# Patient Record
Sex: Male | Born: 1970 | Race: Black or African American | Hispanic: No | Marital: Married | Smoking: Never smoker
Health system: Southern US, Community
[De-identification: ages and names within clinical notes are randomized; demographics above are authoritative.]

## PROBLEM LIST (undated history)

## (undated) DIAGNOSIS — E119 Type 2 diabetes mellitus without complications: Secondary | ICD-10-CM

## (undated) DIAGNOSIS — K219 Gastro-esophageal reflux disease without esophagitis: Secondary | ICD-10-CM

---

## 2003-11-07 ENCOUNTER — Other Ambulatory Visit: Payer: Self-pay

## 2004-08-27 ENCOUNTER — Emergency Department: Payer: Self-pay | Admitting: Emergency Medicine

## 2004-08-27 ENCOUNTER — Other Ambulatory Visit: Payer: Self-pay

## 2004-08-28 ENCOUNTER — Other Ambulatory Visit: Payer: Self-pay

## 2005-04-08 ENCOUNTER — Emergency Department: Payer: Self-pay | Admitting: Emergency Medicine

## 2005-08-23 ENCOUNTER — Emergency Department: Payer: Self-pay | Admitting: Emergency Medicine

## 2005-10-24 ENCOUNTER — Ambulatory Visit: Payer: Self-pay | Admitting: Ophthalmology

## 2007-07-26 IMAGING — CR DG CHEST 2V
1 series · 2 of 2 positions shown · non-contrast
Comparison: none

REASON FOR EXAM: Chest pain
COMMENTS:

PROCEDURE:     DXR - DXR CHEST PA (OR AP) AND LATERAL  - October 24, 2005 [DATE]
RESULT:     The lung fields are clear. No pneumonia, pneumothorax or pleural
effusion is seen. The heart, mediastinal and osseous structures show no
significant abnormalities.

[Series 1: view not recorded · 0.17mm/px · 2 of 2 slices shown]
[im 1/2]
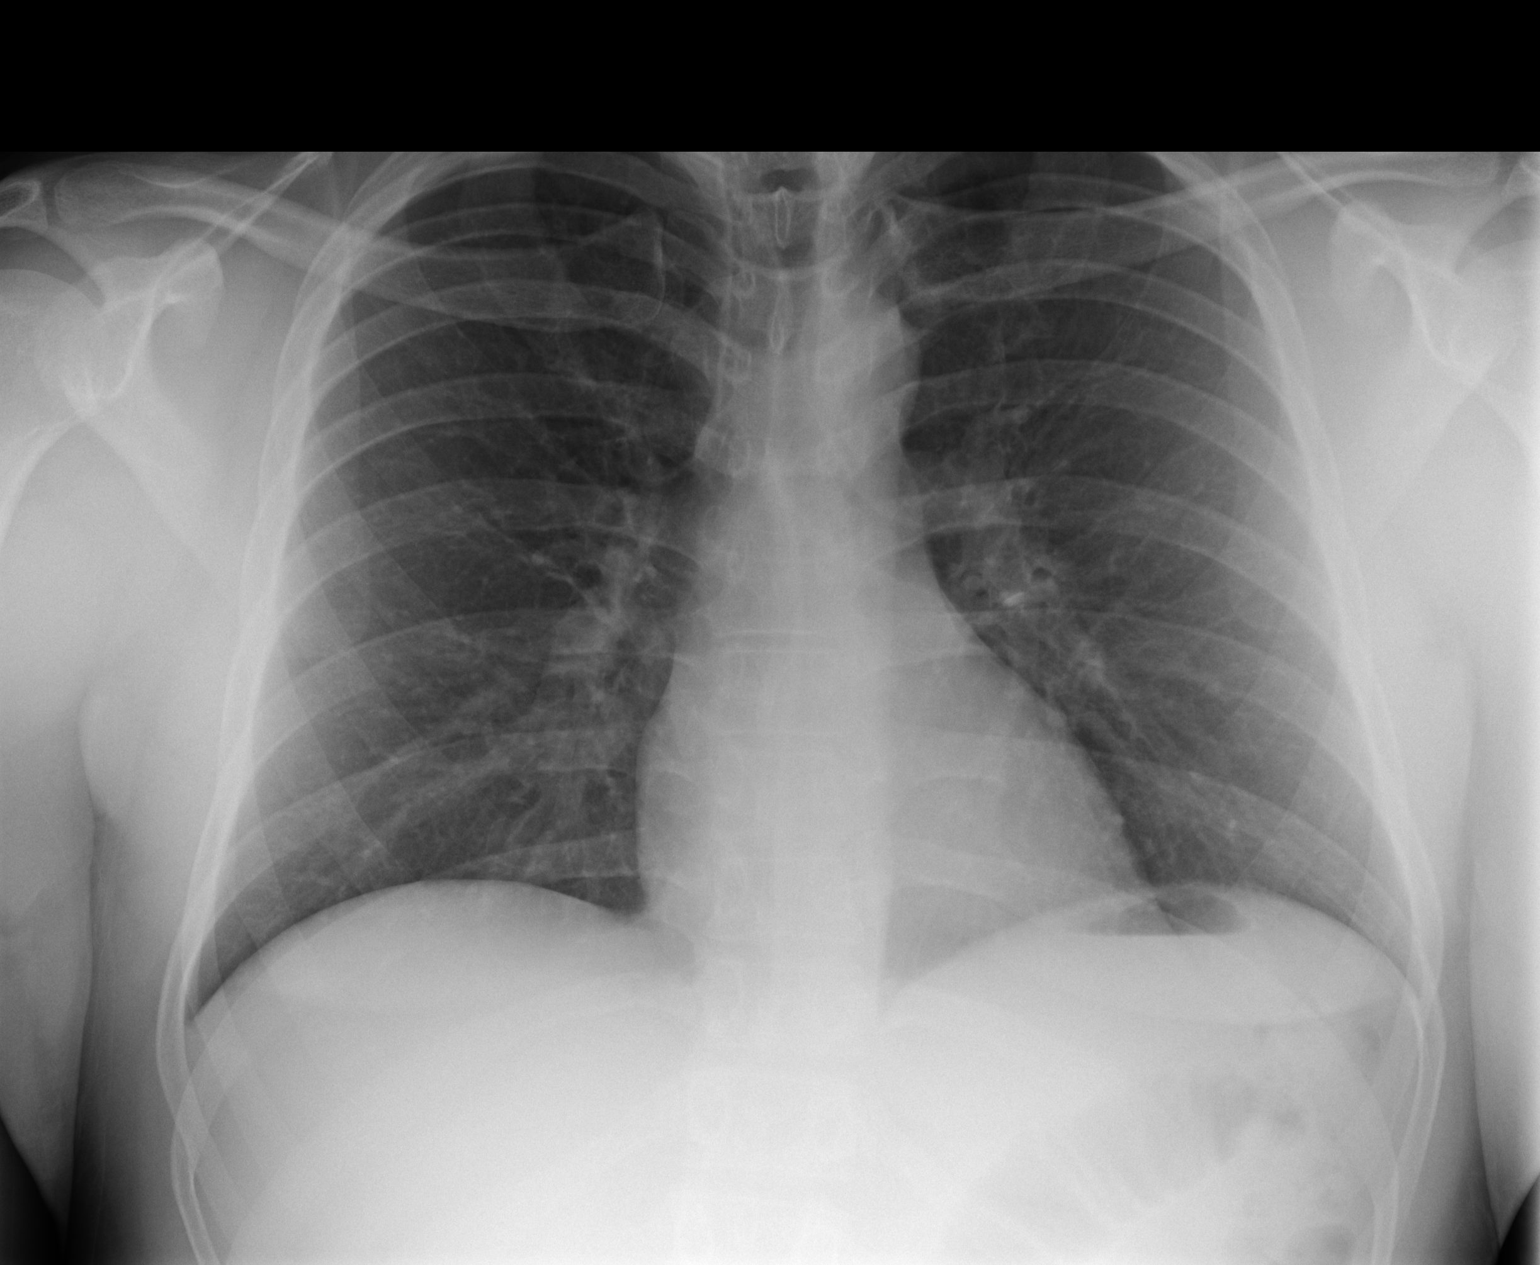
[im 2/2]
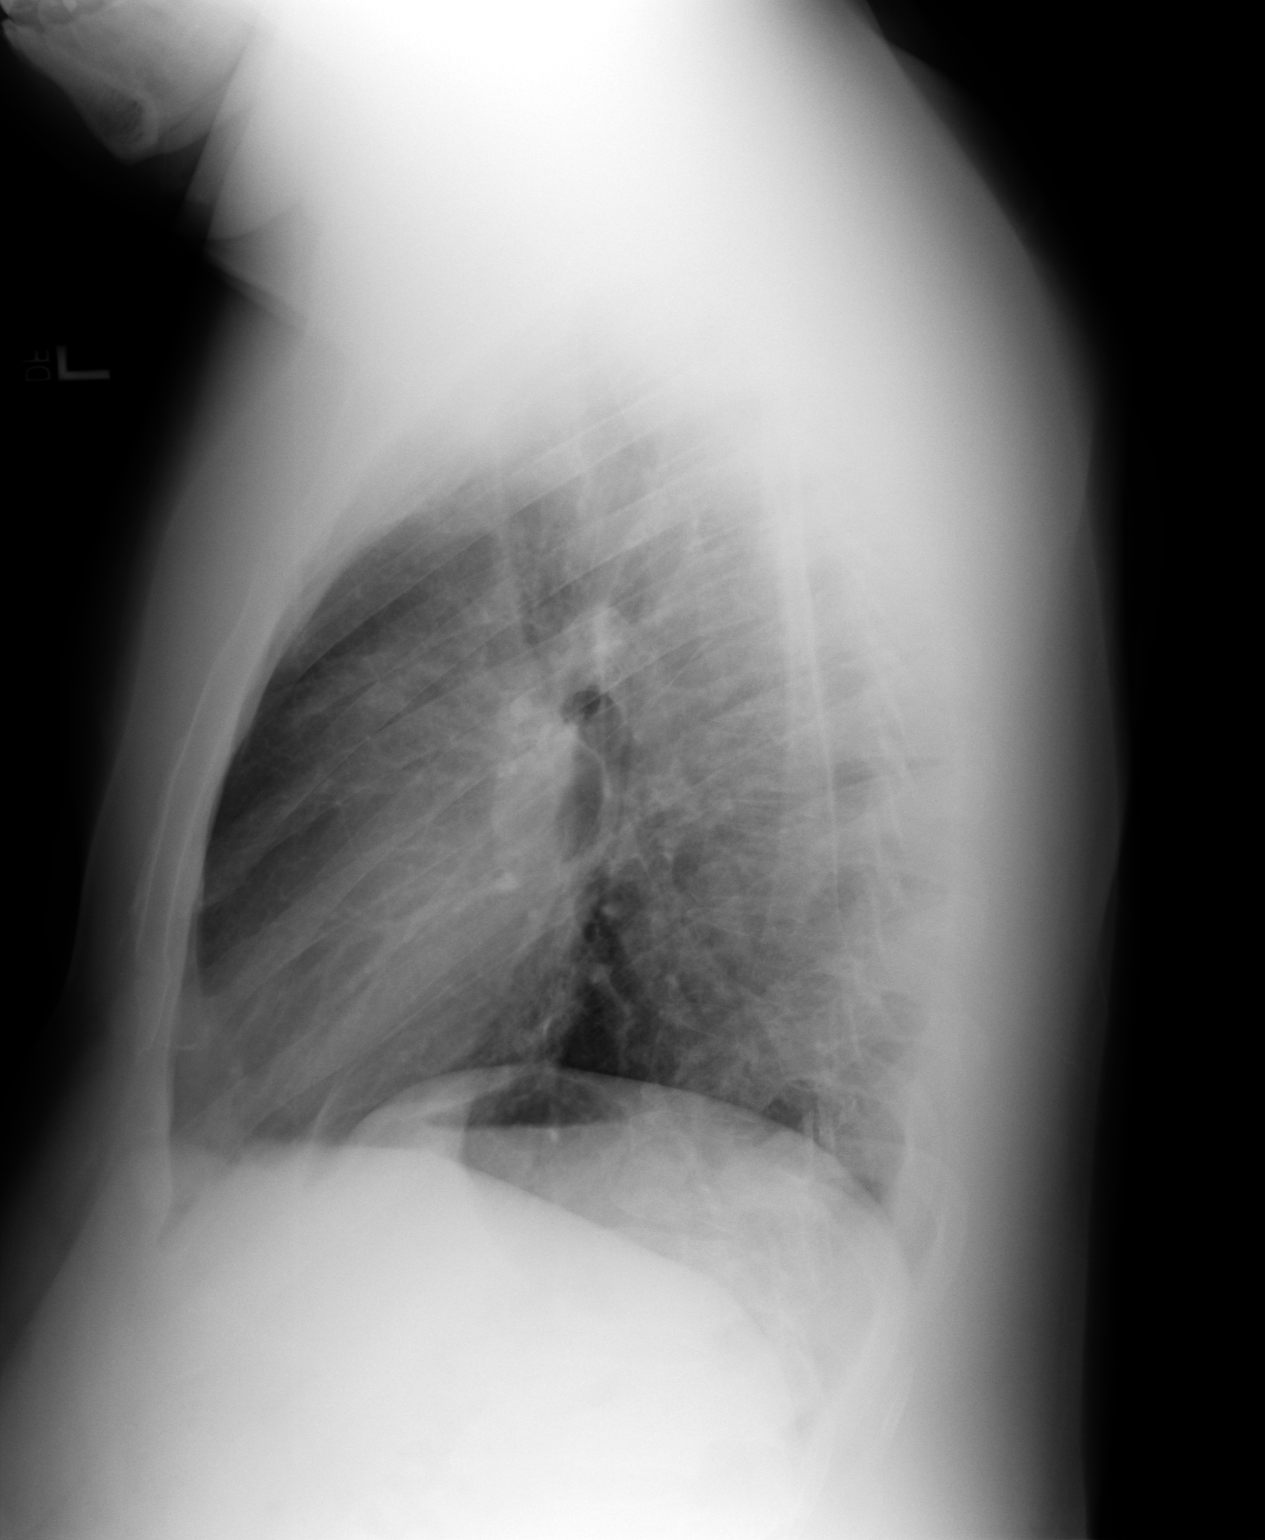

[2 of 2 positions shown; findings below may reference images not displayed]

IMPRESSION: No acute changes are identified.

## 2007-12-11 ENCOUNTER — Ambulatory Visit: Payer: Self-pay | Admitting: Internal Medicine

## 2010-03-23 ENCOUNTER — Ambulatory Visit: Payer: Self-pay | Admitting: Internal Medicine

## 2010-11-07 ENCOUNTER — Ambulatory Visit: Payer: Self-pay | Admitting: Family Medicine

## 2013-11-24 DIAGNOSIS — K429 Umbilical hernia without obstruction or gangrene: Secondary | ICD-10-CM | POA: Insufficient documentation

## 2014-03-02 ENCOUNTER — Ambulatory Visit: Payer: Self-pay | Admitting: Family Medicine

## 2017-02-06 DIAGNOSIS — R7303 Prediabetes: Secondary | ICD-10-CM | POA: Insufficient documentation

## 2017-11-04 DIAGNOSIS — G4730 Sleep apnea, unspecified: Secondary | ICD-10-CM | POA: Insufficient documentation

## 2017-12-06 ENCOUNTER — Other Ambulatory Visit: Payer: Self-pay

## 2017-12-06 ENCOUNTER — Ambulatory Visit
Admission: EM | Admit: 2017-12-06 | Discharge: 2017-12-06 | Disposition: A | Discharge status: Home / Self Care | Payer: BLUE CROSS/BLUE SHIELD | Attending: Family Medicine | Admitting: Family Medicine

## 2017-12-06 DIAGNOSIS — H1032 Unspecified acute conjunctivitis, left eye: Secondary | ICD-10-CM

## 2017-12-06 HISTORY — DX: Type 2 diabetes mellitus without complications: E11.9

## 2017-12-06 HISTORY — DX: Gastro-esophageal reflux disease without esophagitis: K21.9

## 2017-12-06 MED ORDER — MOXIFLOXACIN HCL 0.5 % OP SOLN: 1.0000 [drp] | Freq: Three times a day (TID) | OPHTHALMIC | 0 refills | Status: DC

## 2017-12-06 NOTE — ED Triage Notes (Addendum)
Pt reports he is being treated for a sinus infection. Awoke this a.m. With left eye crusted shut and reddened sclera VISUAL ACUITY: Uncorrected Left eye: 20/30 Right eye: 20/50 Both eyes: 20/40

## 2017-12-06 NOTE — ED Provider Notes (Signed)
[facility]    CSN: [id number] Arrival date & time: 12/06/17  1426     History   Chief Complaint Chief Complaint  Patient presents with  . Eye Problem    HPI William Hall is a 47 y.o. male.   The history is provided by the patient.  Eye Problem  Location:  Left eye Quality:  Tearing Severity:  Moderate Onset quality:  Sudden Duration:  2 days Timing:  Constant Progression:  Worsening Chronicity:  New Context: not burn, not chemical exposure, not contact lens problem, not direct trauma, not foreign body, not using machinery, not scratch, not smoke exposure and not UV exposure   Relieved by:  None tried Ineffective treatments:  None tried Associated symptoms: crusting, discharge and redness   Associated symptoms: no blurred vision, no decreased vision, no double vision, no facial rash, no headaches, no inflammation, no itching, no nausea, no numbness, no photophobia, no scotomas, no swelling, no tearing, no tingling, no vomiting and no weakness   Risk factors: exposure to pinkeye and recent URI   Risk factors: no conjunctival hemorrhage, no previous injury to eye and no recent herpes zoster     Past Medical History:  Diagnosis Date  . Diabetes mellitus without complication (HCC)   . GERD (gastroesophageal reflux disease)     There are no active problems to display for this patient.   History reviewed. No pertinent surgical history.     Home Medications    Prior to Admission medications   Medication Sig Start Date End Date Taking? Authorizing Provider  metFORMIN (GLUCOPHAGE) 1000 MG tablet Take 1,000 mg by mouth 2 (two) times daily with a meal.   Yes [provider]  pantoprazole (PROTONIX) 20 MG tablet Take 20 mg by mouth daily.   Yes [provider]  penicillin v potassium (VEETID) 500 MG tablet Take 500 mg by mouth 4 (four) times daily.   Yes [provider]  moxifloxacin (VIGAMOX) 0.5 % ophthalmic solution Place 1  drop into the left eye 3 (three) times daily. 12/06/17   Payton Mccallum, MD    Family History Family History  Problem Relation Age of Onset  . Diabetes Mother   . Cancer Father     Social History Social History   Tobacco Use  . Smoking status: Never Smoker  . Smokeless tobacco: Never Used  Substance Use Topics  . Alcohol use: No    Frequency: Never  . Drug use: No     Allergies   Patient has no known allergies.   Review of Systems Review of Systems  Eyes: Positive for discharge and redness. Negative for blurred vision, double vision, photophobia and itching.  Gastrointestinal: Negative for nausea and vomiting.  Neurological: Negative for tingling, weakness, numbness and headaches.     Physical Exam Triage Vital Signs ED Triage Vitals [12/06/17 1509]  Enc Vitals Group     BP 126/75     Pulse Rate 60     Resp 18     Temp 98.1 F (36.7 C)     Temp Source Oral     SpO2 99 %     Weight 270 lb (122.5 kg)     Height 6' (1.829 m)     Head Circumference      Peak Flow      Pain Score 1     Pain Loc      Pain Edu?      Excl. in [place]?    No  data found.  Updated Vital Signs BP 126/75 (BP Location: Right Arm)   Pulse 60   Temp 98.1 F (36.7 C) (Oral)   Resp 18   Ht 6' (1.829 m)   Wt 270 lb (122.5 kg)   SpO2 99%   BMI 36.62 kg/m   Visual Acuity Right Eye Distance:   Left Eye Distance:   Bilateral Distance:    Right Eye Near:   Left Eye Near:    Bilateral Near:     Physical Exam  Constitutional: He appears well-developed and well-nourished. No distress.  Eyes: EOM and lids are normal. Pupils are equal, round, and reactive to light. Left eye exhibits discharge. Left conjunctiva is injected.  Skin: He is not diaphoretic.  Nursing note and vitals reviewed.    UC Treatments / Results  Labs (all labs ordered are listed, but only abnormal results are displayed) Labs Reviewed - No data to display  EKG  EKG Interpretation None       Radiology No  results found.  Procedures Procedures (including critical care time)  Medications Ordered in UC Medications - No data to display   Initial Impression / Assessment and Plan / UC Course  I have reviewed the triage vital signs and the nursing notes.  Pertinent labs & imaging results that were available during my care of the patient were reviewed by me and considered in my medical decision making (see chart for details).       Final Clinical Impressions(s) / UC Diagnoses   Final diagnoses:  Acute bacterial conjunctivitis of left eye    ED Discharge Orders        Ordered    moxifloxacin (VIGAMOX) 0.5 % ophthalmic solution  3 times daily     12/06/17 1634     1. diagnosis reviewed with patient 2. rx as per orders above; reviewed possible side effects, interactions, risks and benefits  3. Recommend supportive treatment with cool compresses prn; otc analgesics prn 4. Follow-up prn if symptoms worsen or don't improve  Controlled Substance Prescriptions [name] Controlled Substance Registry consulted? Not Applicable   Payton Mccallumonty, Nikolus Marczak, MD 12/06/17 [phone]

## 2019-07-27 ENCOUNTER — Other Ambulatory Visit: Payer: Self-pay

## 2019-07-27 ENCOUNTER — Emergency Department
Admission: EM | Admit: 2019-07-27 | Discharge: 2019-07-27 | Disposition: A | Discharge status: Home / Self Care | Payer: BC Managed Care – PPO | Attending: Emergency Medicine | Admitting: Emergency Medicine

## 2019-07-27 DIAGNOSIS — Z79899 Other long term (current) drug therapy: Secondary | ICD-10-CM | POA: Diagnosis not present

## 2019-07-27 DIAGNOSIS — R200 Anesthesia of skin: Secondary | ICD-10-CM | POA: Insufficient documentation

## 2019-07-27 DIAGNOSIS — E119 Type 2 diabetes mellitus without complications: Secondary | ICD-10-CM | POA: Diagnosis not present

## 2019-07-27 DIAGNOSIS — Z7984 Long term (current) use of oral hypoglycemic drugs: Secondary | ICD-10-CM | POA: Insufficient documentation

## 2019-07-27 DIAGNOSIS — G5721 Lesion of femoral nerve, right lower limb: Secondary | ICD-10-CM

## 2019-07-27 DIAGNOSIS — G5712 Meralgia paresthetica, left lower limb: Secondary | ICD-10-CM | POA: Insufficient documentation

## 2019-07-27 DIAGNOSIS — G5711 Meralgia paresthetica, right lower limb: Secondary | ICD-10-CM | POA: Diagnosis not present

## 2019-07-27 DIAGNOSIS — R202 Paresthesia of skin: Secondary | ICD-10-CM

## 2019-07-27 MED ORDER — PREDNISONE 20 MG PO TABS: ORAL_TABLET | ORAL | 0 refills | Status: DC

## 2019-07-27 NOTE — ED Provider Notes (Signed)
[facility] Emergency Department Provider Note ____________________________________________  Time seen: [id number]  I have reviewed the triage vital signs and the nursing notes.  HISTORY  Chief Complaint  Groin Numbness  HPI William Hall is a 48 y.o. male presents himself to the ED for evaluation of sensation change to the right groin.   Patient describes sensation of numbness, similar to when "you are at the dentist," to the skin along his right anterior superior leg spine, and into the groin.  Patient denies any pain whatsoever, or discomfort. He denies any bladder or bowel incontinence, saddle anesthesia, foot drop, or distal paresthesias.  Patient just describes when he touches that area of his inner leg and thigh that it feels different than the other side.  He denies any skin changes, rash, or lesions.  He does not currently work and denies any prolonged pressure to the waist band as with tight clothing or a heavy tool belt or utility belt.  Patient is been seen about 2 weeks ago for some right-sided flank pain, and was evaluated with a CT stone study.  That imaging revealed some mild L5-S1 to retrolisthesis and vacuum disc deformity.  There is no indication of any herniated or ruptured disc on that imaging.  Patient was treated with antibiotics and reports provement of his symptoms.  He was also discharged with prescriptions for meloxicam and Flexeril, but admits to not taking medications as he felt that his this would not musculoskeletal in nature.  Patient presents today for evaluation of his sensory changes to the area between his right lateral waist and his right anterior groin and thigh.  He denies any difficulty with urination, dysuria, retention, constipation, diarrhea.  Past Medical History:  Diagnosis Date  . Diabetes mellitus without complication (HCC)   . GERD (gastroesophageal reflux disease)     There are no active problems to display for this  patient.   History reviewed. No pertinent surgical history.  Prior to Admission medications   Medication Sig Start Date End Date Taking? Authorizing Provider  buPROPion (ZYBAN) 150 MG 12 hr tablet Take 150 mg by mouth daily.   Yes [provider]  hydrochlorothiazide (HYDRODIURIL) 25 MG tablet Take 25 mg by mouth daily.   Yes [provider]  omeprazole (PRILOSEC) 20 MG capsule Take 20 mg by mouth daily.   Yes [provider]  traMADol (ULTRAM) 50 MG tablet Take 50 mg by mouth every 12 (twelve) hours as needed for moderate pain.   Yes [provider]  predniSONE (DELTASONE) 20 MG tablet Take 2 tab daily for 4 days, Take 1 tab daily for 4 days, Take 0.5 tabs for 4 days 07/27/19   Brianny Soulliere, Charlesetta IvoryJenise V Bacon, PA-C  metFORMIN (GLUCOPHAGE) 1000 MG tablet Take 1,000 mg by mouth 2 (two) times daily with a meal.  07/27/19  [provider]  pantoprazole (PROTONIX) 20 MG tablet Take 20 mg by mouth daily.  07/27/19  [provider]    Allergies Patient has no known allergies.  Family History  Problem Relation Age of Onset  . Diabetes Mother   . Cancer Father     Social History Social History   Tobacco Use  . Smoking status: Never Smoker  . Smokeless tobacco: Never Used  Substance Use Topics  . Alcohol use: No    Frequency: Never  . Drug use: No    Review of Systems  Constitutional: Negative for fever. Eyes: Negative for visual changes. ENT: Negative for sore  throat. Cardiovascular: Negative for chest pain. Respiratory: Negative for shortness of breath. Gastrointestinal: Negative for abdominal pain, vomiting and diarrhea. Genitourinary: Negative for dysuria. Musculoskeletal: Negative for back pain. Skin: Negative for rash.  Neurological: Negative for headaches, focal weakness.  Reports right groin numbness as above. ____________________________________________  PHYSICAL EXAM:  VITAL SIGNS: ED Triage Vitals  Enc Vitals Group      BP 07/27/19 1415 (!) 158/96     Pulse Rate 07/27/19 1415 74     Resp 07/27/19 1415 18     Temp 07/27/19 1415 99.1 F (37.3 C)     Temp Source 07/27/19 1415 Oral     SpO2 07/27/19 1415 100 %     Weight 07/27/19 1416 274 lb (124.3 kg)     Height 07/27/19 1416 6' (1.829 m)     Head Circumference --      Peak Flow --      Pain Score 07/27/19 1416 4     Pain Loc --      Pain Edu? --      Excl. in [place]? --     Constitutional: Alert and oriented. Well appearing and in no distress. Head: Normocephalic and atraumatic. Eyes: Conjunctivae are normal. Normal extraocular movements Cardiovascular: Normal rate, regular rhythm. Normal distal pulses. Respiratory: Normal respiratory effort. No wheezes/rales/rhonchi. Gastrointestinal: Soft and nontender. No distention. Musculoskeletal: Normal spinal alignment without midline tenderness, spasm, deformity, or step-off.  Patient has a sit stand without assistance.  Liver temperature normal lumbar flexion extension range.  Normal hip flexion on exam.  Normal hip internal and external rotation without difficulty.  Patient is and tenderness to palpation to the right and left inguinal regions.   GU: Deferred Nontender with normal range of motion in all extremities.  Neurologic: Cranial nerves II through XII grossly intact.  Normal LE DTRs bilaterally.  Normal supine straight leg raise bilaterally.  Normal gait without ataxia. Normal speech and language. No gross focal neurologic deficits are appreciated. Skin:  Skin is warm, dry and intact. No rash noted. ____________________________________________  PROCEDURES  Procedures ____________________________________________  INITIAL IMPRESSION / ASSESSMENT AND PLAN / ED COURSE  Patient with ED evaluation of sensory changes to the skin of his right inguinal groin region.  Patient exam is overall benign not any acute neuromuscular deficit on indication of any acute spinal injury.  There are no red flags to  indicate a spinal mass, spinal abscess, herniated or ruptured disc, DVT, or inguinal hernia.  Patient's presentation is superficial sensory nerve irritation and paresthesias in nature, and may represent a lateral femoral cutaneous nerve irritation versus meralgia paresthetica.  He is discharged with a prescription for prednisone taper over the next 9 days.  He is referred to neurology for ongoing management of his superficial sensory nerve irritation.  William Hall was evaluated in Emergency Department on 07/27/2019 for the symptoms described in the history of present illness. He was evaluated in the context of the global COVID-19 pandemic, which necessitated consideration that the patient might be at risk for infection with the SARS-CoV-2 virus that causes COVID-19. Institutional protocols and algorithms that pertain to the evaluation of patients at risk for COVID-19 are in a state of rapid change based on information released by regulatory bodies including the CDC and federal and state organizations. These policies and algorithms were followed during the patient's care in the ED. 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

## 2019-07-27 NOTE — ED Notes (Signed)
See triage note  Presents with numbness to right lateral hip area and it moves into groin  States he was seen and had several tests done last week  States pain is still in flank area but it is a "deeper pain"  His main concern is the numbness to groin

## 2019-07-27 NOTE — ED Triage Notes (Signed)
Says feels numb in right groin area.  Says he recently had back problems.

## 2019-07-27 NOTE — Discharge Instructions (Signed)
Your exam is basically normal without any signs of a serious nerve or spinal cor injury causing emergency conditions like loss of bladder/bowel control, foot drop, or crotch numbness. You will be referred to follow-up with Neurology for ongoing management. Take the prescription meds as directed.

## 2019-07-27 NOTE — ED Triage Notes (Signed)
Pt reports right hip numbness that radiates to right groin. Pt has been evaluated recently for right lower pain back that radiates through right hip--was dx with "something about my L5S1". Reports numbness began yesterday. Ambulates safely. Reports that he is continent with bowel and bladder. Doctor suggested patient come here.

## 2020-08-07 DIAGNOSIS — K219 Gastro-esophageal reflux disease without esophagitis: Secondary | ICD-10-CM | POA: Insufficient documentation

## 2020-08-07 DIAGNOSIS — G4733 Obstructive sleep apnea (adult) (pediatric): Secondary | ICD-10-CM | POA: Insufficient documentation

## 2021-11-21 DIAGNOSIS — M1712 Unilateral primary osteoarthritis, left knee: Secondary | ICD-10-CM | POA: Insufficient documentation

## 2021-11-21 DIAGNOSIS — M25562 Pain in left knee: Secondary | ICD-10-CM | POA: Insufficient documentation

## 2022-07-22 NOTE — Progress Notes (Signed)
Sleep Medicine   Office Visit  Patient Name: William Hall DOB: [birth year] MRN [id number]    Chief Complaint: OSA  Brief History:  Lamaj is seen today for an initial consult for CPAP@ 8 cmH2O and to establish care. The patient has a 4 year history of sleep apnea. Patient is not using PAP consistently nightly.The patient feels not rested after sleeping with PAP.  He attributes this mostly to the lack of hours he gets due to his hectic work schedule. The patient reports benefit from PAP use. Reported sleepiness is  somewhat improved and the Epworth Sleepiness Score is 7 out of 24. The patient does not take naps. The patient complains of the following: dry mouth. Discussed the humidity and using a mouth rinse.  The compliance download shows 44% compliance with an average use time of 4 hours 15 minutes. The AHI is 1.3.  The patient does not complain of limb movements disrupting sleep. The patient continues to require PAP therapy in order to eliminate his sleep apnea.   ROS  General: (-) fever, (-) chills, (-) night sweat Nose and Sinuses: (-) nasal stuffiness or itchiness, (-) postnasal drip, (-) nosebleeds, (-) sinus trouble. Mouth and Throat: (-) sore throat, (-) hoarseness. Neck: (-) swollen glands, (-) enlarged thyroid, (-) neck pain. Respiratory: - cough, - shortness of breath, - wheezing. Neurologic: - numbness, - tingling. Psychiatric: - anxiety, - depression    Current Medication: Outpatient Encounter Medications as of 07/23/2022  Medication Sig   diclofenac (VOLTAREN) 50 MG EC tablet Take by mouth.   hydrochlorothiazide (HYDRODIURIL) 25 MG tablet Take 25 mg by mouth daily.   meloxicam (MOBIC) 15 MG tablet Take 15 mg by mouth daily.   metFORMIN (GLUCOPHAGE) 500 MG tablet SMARTSIG:1 Tablet(s) By Mouth Every Evening   omeprazole (PRILOSEC) 20 MG capsule Take 20 mg by mouth daily.   OZEMPIC, 2 MG/DOSE, 8 MG/3ML SOPN Inject 2 mg into the skin once a week.   [DISCONTINUED]  buPROPion (ZYBAN) 150 MG 12 hr tablet Take 150 mg by mouth daily.   [DISCONTINUED] metFORMIN (GLUCOPHAGE) 1000 MG tablet Take 1,000 mg by mouth 2 (two) times daily with a meal.   [DISCONTINUED] pantoprazole (PROTONIX) 20 MG tablet Take 20 mg by mouth daily.   [DISCONTINUED] predniSONE (DELTASONE) 20 MG tablet Take 2 tab daily for 4 days, Take 1 tab daily for 4 days, Take 0.5 tabs for 4 days   [DISCONTINUED] traMADol (ULTRAM) 50 MG tablet Take 50 mg by mouth every 12 (twelve) hours as needed for moderate pain.   No facility-administered encounter medications on file as of 07/23/2022.    Surgical History: History reviewed. No pertinent surgical history.  Medical History: Past Medical History:  Diagnosis Date   Diabetes mellitus without complication (HCC)    GERD (gastroesophageal reflux disease)     Family History: Non contributory to the present illness  Social History: Social History   Socioeconomic History   Marital status: Married    Spouse name: Not on file   Number of children: Not on file   Years of education: Not on file   Highest education level: Not on file  Occupational History   Not on file  Tobacco Use   Smoking status: Never   Smokeless tobacco: Never  Substance and Sexual Activity   Alcohol use: No   Drug use: No   Sexual activity: Not on file  Other Topics Concern   Not on file  Social History Narrative   Not on file  Social Determinants of Health   Financial Resource Strain: Not on file  Food Insecurity: Not on file  Transportation Needs: Not on file  Physical Activity: Not on file  Stress: Not on file  Social Connections: Not on file  Intimate Partner Violence: Not on file    Vital Signs: Blood pressure (!) 135/91, pulse 86, resp. rate 16, height 6' (1.829 m), weight 271 lb 3.2 oz (123 kg), SpO2 96 %. Body mass index is 36.78 kg/m.   Examination: General Appearance: The patient is well-developed, well-nourished, and in no distress. Neck  Circumference: 45cm Skin: Gross inspection of skin unremarkable. Head: normocephalic, no gross deformities. Eyes: no gross deformities noted. ENT: ears appear grossly normal Neurologic: Alert and oriented. No involuntary movements.    STOP BANG RISK ASSESSMENT S (snore) Have you been told that you snore?     No   T (tired) Are you often tired, fatigued, or sleepy during the day?   YES  O (obstruction) Do you stop breathing, choke, or gasp during sleep? NO   P (pressure) Do you have or are you being treated for high blood pressure? YES   B (BMI) Is your body index greater than 35 kg/m? YES   A (age) Are you 51 years old or older? YES   N (neck) Do you have a neck circumference greater than 16 inches?   YES   G (gender) Are you a male? YES   TOTAL STOP/BANG "YES" ANSWERS 6                                                               A STOP-Bang score of 2 or less is considered low risk, and a score of 5 or more is high risk for having either moderate or severe OSA. For people who score 3 or 4, doctors may need to perform further assessment to determine how likely they are to have OSA.         EPWORTH SLEEPINESS SCALE:  Scale:  (0)= no chance of dozing; (1)= slight chance of dozing; (2)= moderate chance of dozing; (3)= high chance of dozing  Chance  Situtation    Sitting and reading: 1    Watching TV: 2    Sitting Inactive in public: 1    As a passenger in car: 1      Lying down to rest: 1    Sitting and talking: 0    Sitting quielty after lunch: 1    In a car, stopped in traffic: 0   TOTAL SCORE:   7 out of 24    SLEEP STUDIES:  Split Study (10/2017) AHI 56/hr, non REM AHI 51/hr, Supine AHI 86/hr, lateral AHI 18/hr, min SPO2 80%, CPAP@ 8 cmH2O   CPAP COMPLIANCE DATA:  Date Range: 07/18/2021-07/17/2022  Average Daily Use: 4 hours 15 minutes  Median Use: 4 hours 4 minutes  Compliance for > 4 Hours: 44 %  AHI: 1.3 respiratory events per  hour  Days Used: 301/365 days  Mask Leak: 8.6  95th Percentile Pressure: 8    LABS: No results found for this or any previous visit (from the past 2160 hour(s)).  Radiology: No results found.  No results found.  No results found.    Assessment and Plan: Patient Active  Problem List   Diagnosis Date Noted   Hypertension 07/23/2022   OSA on CPAP 08/07/2020   Gastroesophageal reflux disease without esophagitis 08/07/2020     PLAN OSA:  Patient does tolerate and benefit from PAP use. Compliance is poor due to limited sleep time and falling asleep before putting machine on. AHI is 1.3.  Pt continues to require cpap to treat his osa and is medically necessary. Dicussed sleep hygiene and increasing sleep time and use on machine. Advised to set alarm or reminder to put mask on.   1. OSA on CPAP Increase sleep time and use nightly  2. CPAP use counseling CPAP couseling-Discussed importance of adequate CPAP use as well as proper care and cleaning techniques of machine and all supplies.  3. Primary hypertension Continue current medication and f/u with PCP.  4. Gastroesophageal reflux disease without esophagitis Continue PPI  5. Obesity (BMI 30-39.9) Obesity Counseling: Had a lengthy discussion regarding patients BMI and weight issues. Patient was instructed on portion control as well as increased activity. Also discussed caloric restrictions with trying to maintain intake less than 2000 Kcal. Discussions were made in accordance with the 5As of weight management. Simple actions such as not eating late and if able to, taking a walk is suggested.    General Counseling: I have discussed the findings of the evaluation and examination with Onalee Hua.  I have also discussed any further diagnostic evaluation thatmay be needed or ordered today. Onalee Hua verbalizes understanding of the findings of todays visit. We also reviewed his medications today and discussed drug interactions and side  effects including but not limited excessive drowsiness and altered mental states. We also discussed that there is always a risk not just to him but also people around him. he has been encouraged to call the office with any questions or concerns that should arise related to todays visit.  No orders of the defined types were placed in this encounter.       I have personally obtained a history, evaluated the patient, evaluated pertinent data, formulated the assessment and plan and placed orders.  This patient was seen by Lynn Ito, PA-C in collaboration with Dr. Freda Munro as a part of collaborative care agreement.    Yevonne Pax, MD [facility] Diplomate ABMS Pulmonary and Critical Care Medicine Sleep medicine

## 2022-07-23 ENCOUNTER — Ambulatory Visit (INDEPENDENT_AMBULATORY_CARE_PROVIDER_SITE_OTHER): Payer: 59 | Admitting: Internal Medicine

## 2022-07-23 VITALS — BP 135/91 | HR 86 | Resp 16 | Ht 72.0 in | Wt 271.2 lb

## 2022-07-23 DIAGNOSIS — Z7189 Other specified counseling: Secondary | ICD-10-CM | POA: Diagnosis not present

## 2022-07-23 DIAGNOSIS — K219 Gastro-esophageal reflux disease without esophagitis: Secondary | ICD-10-CM

## 2022-07-23 DIAGNOSIS — I10 Essential (primary) hypertension: Secondary | ICD-10-CM

## 2022-07-23 DIAGNOSIS — G4733 Obstructive sleep apnea (adult) (pediatric): Secondary | ICD-10-CM

## 2022-07-23 DIAGNOSIS — E669 Obesity, unspecified: Secondary | ICD-10-CM

## 2022-07-23 NOTE — Patient Instructions (Signed)

## 2023-02-12 ENCOUNTER — Ambulatory Visit
Admission: EM | Admit: 2023-02-12 | Discharge: 2023-02-12 | Disposition: A | Discharge status: Home / Self Care | Payer: 59 | Attending: Family Medicine | Admitting: Family Medicine

## 2023-02-12 DIAGNOSIS — Z91199 Patient's noncompliance with other medical treatment and regimen due to unspecified reason: Secondary | ICD-10-CM | POA: Diagnosis present

## 2023-02-12 DIAGNOSIS — R42 Dizziness and giddiness: Secondary | ICD-10-CM | POA: Diagnosis not present

## 2023-02-12 DIAGNOSIS — Z7189 Other specified counseling: Secondary | ICD-10-CM | POA: Diagnosis present

## 2023-02-12 LAB — CBC WITH DIFFERENTIAL/PLATELET
Abs Immature Granulocytes: 0.01 10*3/uL (ref 0.00–0.07)
Basophils Absolute: 0 10*3/uL (ref 0.0–0.1)
Basophils Relative: 1 %
Eosinophils Absolute: 0.1 10*3/uL (ref 0.0–0.5)
Eosinophils Relative: 1 %
HCT: 44.3 % (ref 39.0–52.0)
Hemoglobin: 14.9 g/dL (ref 13.0–17.0)
Immature Granulocytes: 0 %
Lymphocytes Relative: 46 %
Lymphs Abs: 2.9 10*3/uL (ref 0.7–4.0)
MCH: 28.1 pg (ref 26.0–34.0)
MCHC: 33.6 g/dL (ref 30.0–36.0)
MCV: 83.4 fL (ref 80.0–100.0)
Monocytes Absolute: 0.4 10*3/uL (ref 0.1–1.0)
Monocytes Relative: 7 %
Neutro Abs: 2.9 10*3/uL (ref 1.7–7.7)
Neutrophils Relative %: 45 %
Platelets: 231 10*3/uL (ref 150–400)
RBC: 5.31 MIL/uL (ref 4.22–5.81)
RDW: 13.6 % (ref 11.5–15.5)
WBC: 6.3 10*3/uL (ref 4.0–10.5)
nRBC: 0 % (ref 0.0–0.2)

## 2023-02-12 LAB — COMPREHENSIVE METABOLIC PANEL
ALT: 25 U/L (ref 0–44)
AST: 21 U/L (ref 15–41)
Albumin: 4 g/dL (ref 3.5–5.0)
Alkaline Phosphatase: 71 U/L (ref 38–126)
Anion gap: 9 (ref 5–15)
BUN: 13 mg/dL (ref 6–20)
CO2: 25 mmol/L (ref 22–32)
Calcium: 8.8 mg/dL — ABNORMAL LOW (ref 8.9–10.3)
Chloride: 98 mmol/L (ref 98–111)
Creatinine, Ser: 1.18 mg/dL (ref 0.61–1.24)
GFR, Estimated: >60 mL/min (ref >60)
Glucose, Bld: 90 mg/dL (ref 70–99)
Potassium: 3.2 mmol/L — ABNORMAL LOW (ref 3.5–5.1)
Sodium: 132 mmol/L — ABNORMAL LOW (ref 135–145)
Total Bilirubin: 0.8 mg/dL (ref 0.3–1.2)
Total Protein: 7.8 g/dL (ref 6.5–8.1)

## 2023-02-12 LAB — GLUCOSE, CAPILLARY: Glucose-Capillary: 82 mg/dL (ref 70–99)

## 2023-02-12 NOTE — Discharge Instructions (Addendum)
Your EKG did not show any new concerning findings. Your blood work did not show signs of infection or inflammation. Your sodium level and potassium level are slightly low. See list of potassium rich foods.  Be sure to eat some salty food over the next day or so to bring your sodium up slowly. Your other electrolytes, liver and kidney are functioning at their baseline.   Follow up with your primary care provider. Wear your CPAP regularly.    Go to the emergency department if your dizziness is accompanied by chest pain, headache, shortness of breath or vomiting.

## 2023-02-12 NOTE — ED Triage Notes (Signed)
Pt c/o constant dizziness x3 days. Also c/o HA that started today, states his eyes have been very sensitive to light today. Has tried ibuprofen w/minor relief. Hx of htn, takes BP meds at night.

## 2023-02-12 NOTE — ED Provider Notes (Signed)
[facility]    CSN: [id number] Arrival date & time: 02/12/23  1816      History   Chief Complaint Chief Complaint  Patient presents with   Dizziness    HPI William Hall is a 52 y.o. male.   HPI   William Hall presents for constant dizziness for the past 3 days. Dizziness is not room-spinning. He wakes was up the dizziness. He gets nauseous but has not vomited. Feels like he is in a fog. Similar sx in December but it went away in a day. It went away without treatment.  No headache. Denies shortness of breath, diarrhea, fever, chest pain, confusion, headache, difficulty finding words, lack of sleep and increased stress.    He has sleep apnea and uses his CPAP anywhere from 3-7 times a week. Thus far, has only used it once this week. Has diabetes.      Past Medical History:  Diagnosis Date   Diabetes mellitus without complication (HCC)    GERD (gastroesophageal reflux disease)     Patient Active Problem List   Diagnosis Date Noted   Hypertension 07/23/2022   OSA on CPAP 08/07/2020   Gastroesophageal reflux disease without esophagitis 08/07/2020    History reviewed. No pertinent surgical history.     Home Medications    Prior to Admission medications   Medication Sig Start Date End Date Taking? Authorizing Provider  diclofenac (VOLTAREN) 50 MG EC tablet Take by mouth. 05/13/22 05/13/23 Yes [provider]  hydrochlorothiazide (HYDRODIURIL) 25 MG tablet Take 25 mg by mouth daily.   Yes [provider]  HYDROcodone-acetaminophen (NORCO/VICODIN) 5-325 MG tablet Take by mouth. 12/24/22  Yes [provider]  ibuprofen (ADVIL) 600 MG tablet Take 600 mg by mouth every 8 (eight) hours as needed.   Yes [provider]  meloxicam (MOBIC) 15 MG tablet Take 15 mg by mouth daily. 04/15/22  Yes [provider]  metFORMIN (GLUCOPHAGE) 500 MG tablet SMARTSIG:1 Tablet(s) By Mouth Every Evening 04/16/22  Yes [provider]   omeprazole (PRILOSEC) 20 MG capsule Take 20 mg by mouth daily.   Yes [provider]  OZEMPIC, 2 MG/DOSE, 8 MG/3ML SOPN Inject 2 mg into the skin once a week. 06/13/22  Yes [provider]  traMADol (ULTRAM) 50 MG tablet 1-2 tablets every 8 hours as needed for pain 01/09/23  Yes [provider]  pantoprazole (PROTONIX) 20 MG tablet Take 20 mg by mouth daily.  07/27/19  [provider]    Family History Family History  Problem Relation Age of Onset   Diabetes Mother    Cancer Father     Social History Social History   Tobacco Use   Smoking status: Never   Smokeless tobacco: Never  Substance Use Topics   Alcohol use: No   Drug use: No     Allergies   Metformin, Levonorgestrel-ethinyl estrad, and Topiramate   Review of Systems Review of Systems: negative unless otherwise stated in HPI.      Physical Exam Triage Vital Signs ED Triage Vitals  Enc Vitals Group     BP 02/12/23 1823 (!) 147/108     Pulse Rate 02/12/23 1823 89     Resp 02/12/23 1823 16     Temp 02/12/23 1823 98.5 F (36.9 C)     Temp Source 02/12/23 1823 Oral     SpO2 02/12/23 1823 98 %     Weight 02/12/23 1822 258 lb (117 kg)     Height  02/12/23 1822 6' (1.829 m)     Head Circumference --      Peak Flow --      Pain Score 02/12/23 1832 2     Pain Loc --      Pain Edu? --      Excl. in GC? --    Orthostatic VS for the past 24 hrs:  BP- Lying Pulse- Lying BP- Sitting Pulse- Sitting  02/12/23 1845 117/89 76 123/86 80    Updated Vital Signs BP (!) 147/108 (BP Location: Left Arm)   Pulse 89   Temp 98.5 F (36.9 C) (Oral)   Resp 16   Ht 6' (1.829 m)   Wt 117 kg   SpO2 98%   BMI 34.99 kg/m   Visual Acuity Right Eye Distance:   Left Eye Distance:   Bilateral Distance:    Right Eye Near:   Left Eye Near:    Bilateral Near:     Physical Exam GEN:     alert, well appearing male and no distress    HENT:  mucus membranes moist, oropharyngeal without  lesions or erythema,  nares patent, no nasal discharge, TM normal bilaterally   EYES:   pupils equal and reactive, EOM intact NECK:  supple, normal ROM RESP:  clear to auscultation bilaterally, no increased work of breathing  CVS:   regular rate and rhythm, no murmur, distal pulses intact   EXT:   normal ROM, atraumatic, no edema  NEURO:  alert, oriented x4, speech normal, CN 2-12 grossly intact, no facial droop,  sensation grossly intact, strength 5/5 bilateral UE and LE, normal coordination, normal finger to nose, Negative Romberg  Skin:   warm and dry,  normal skin turgor Psych: Normal affect, appropriate speech and behavior      UC Treatments / Results  Labs (all labs ordered are listed, but only abnormal results are displayed) Labs Reviewed  COMPREHENSIVE METABOLIC PANEL - Abnormal; Notable for the following components:      Result Value   Sodium 132 (*)    Potassium 3.2 (*)    Calcium 8.8 (*)    All other components within normal limits  GLUCOSE, CAPILLARY  CBC WITH DIFFERENTIAL/PLATELET  CBG MONITORING, ED    EKG  If EKG performed, see my interpretation in the MDM section  Radiology No results found.   Procedures Procedures (including critical care time)  Medications Ordered in UC Medications - No data to display   Orthostatic VS for the past 24 hrs (Last 3 readings):  BP- Lying Pulse- Lying BP- Sitting Pulse- Sitting BP- Standing at 3 minutes Pulse- Standing at 3 minutes  02/12/23 1845 117/89 76 123/86 80 (!) 131/94 100     Initial Impression / Assessment and Plan / UC Course  I have reviewed the triage vital signs and the nursing notes.  Pertinent labs & imaging results that were available during my care of the patient were reviewed by me and considered in my medical decision making (see chart for details).       Patient is a 52 y.o. male  T2DM, GERD, OSA, HTN, OA who presents for non-room spinning dizziness for the past 3 days.  Overall patient is  well-appearing and afebrile.  He does not have a headache.  There is been no syncope.  Doubt acute CVA is neuroexam is unremarkable.  He does not have any upper respiratory symptoms to suggest labyrinthitis.  No hearing loss to suggest Mnire's disease.  He had a normal  TSH in February 2024.  I do not see any recent head imaging.  He has no signs of vertigo or an acute ear infection.  He is mildly hypertensive BP 147/105 with repeat 123/86.  Orthostatic vitals are negative. Initial CBG 82. EKG is without acute ST or T wave changesunchanged from Nov 2005 interpreted by me. TSH was normal in Feb 2024.Obtained CBC and CMP.  CBC without anemia, leukocytosis or  polycythemia.  CMP  with mild hyponatremia and hypokalemia which is likely secondary to HCTZ. There are no significant electrolyte abnormalities, hypo or hyperglycemia, uremia or acidosis. No liver or kidney function derailments.   I suspect he is mildly hypercapnic from CPAP non-adherence. Risks and benefits for not using CPAP nightly explained. Advised to use nightly.  Pt voiced understanding.   ED and return precautions given and patient/guardian voiced understanding. Discussed MDM, treatment plan and plan for follow-up with patient who agrees with plan.     Final Clinical Impressions(s) / UC Diagnoses   Final diagnoses:  Dizziness  CPAP use counseling  Noncompliance with CPAP treatment     Discharge Instructions      Your EKG did not show any new concerning findings. Your blood work did not show signs of infection or inflammation. Your sodium level and potassium level are slightly low. See list of potassium rich foods.  Be sure to eat some salty food over the next day or so to bring your sodium up slowly. Your other electrolytes, liver and kidney are functioning at their baseline.   Follow up with your primary care provider. Wear your CPAP regularly.    Go to the emergency department if your dizziness is accompanied by chest pain,  headache, shortness of breath or vomiting.      ED Prescriptions   None    PDMP not reviewed this encounter.   Katha Cabal, DO 02/12/23 1954

## 2023-02-21 ENCOUNTER — Other Ambulatory Visit (HOSPITAL_COMMUNITY): Payer: Self-pay | Admitting: Ophthalmology

## 2023-02-21 DIAGNOSIS — H0520 Unspecified exophthalmos: Secondary | ICD-10-CM

## 2023-02-28 ENCOUNTER — Ambulatory Visit
Admission: RE | Admit: 2023-02-28 | Discharge: 2023-02-28 | Disposition: A | Discharge status: Home / Self Care | Payer: 59 | Source: Ambulatory Visit | Attending: Ophthalmology | Admitting: Ophthalmology

## 2023-02-28 DIAGNOSIS — H0520 Unspecified exophthalmos: Secondary | ICD-10-CM | POA: Insufficient documentation

## 2023-02-28 MED ORDER — GADOBUTROL 1 MMOL/ML IV SOLN
10.0000 mL | Freq: Once | INTRAVENOUS | Status: AC | PRN
  Administered 2023-02-28: 10 mL via INTRAVENOUS

## 2023-03-07 ENCOUNTER — Ambulatory Visit: Payer: 59

## 2023-03-25 ENCOUNTER — Emergency Department
Admission: EM | Admit: 2023-03-25 | Discharge: 2023-03-25 | Disposition: A | Discharge status: Home / Self Care | Payer: 59 | Attending: Emergency Medicine | Admitting: Emergency Medicine

## 2023-03-25 DIAGNOSIS — R42 Dizziness and giddiness: Secondary | ICD-10-CM

## 2023-03-25 DIAGNOSIS — H538 Other visual disturbances: Secondary | ICD-10-CM | POA: Insufficient documentation

## 2023-03-25 LAB — URINALYSIS, ROUTINE W REFLEX MICROSCOPIC
Bilirubin Urine: NEGATIVE
Glucose, UA: NEGATIVE mg/dL
Hgb urine dipstick: NEGATIVE
Ketones, ur: NEGATIVE mg/dL
Leukocytes,Ua: NEGATIVE
Nitrite: NEGATIVE
Protein, ur: NEGATIVE mg/dL
Specific Gravity, Urine: 1.023 (ref 1.005–1.030)
pH: 6 (ref 5.0–8.0)

## 2023-03-25 LAB — BASIC METABOLIC PANEL
Anion gap: 12 (ref 5–15)
BUN: 13 mg/dL (ref 6–20)
CO2: 24 mmol/L (ref 22–32)
Calcium: 9.1 mg/dL (ref 8.9–10.3)
Chloride: 101 mmol/L (ref 98–111)
Creatinine, Ser: 1.1 mg/dL (ref 0.61–1.24)
GFR, Estimated: >60 mL/min (ref >60)
Glucose, Bld: 139 mg/dL — ABNORMAL HIGH (ref 70–99)
Potassium: 3.3 mmol/L — ABNORMAL LOW (ref 3.5–5.1)
Sodium: 137 mmol/L (ref 135–145)

## 2023-03-25 LAB — CBC
HCT: 46.1 % (ref 39.0–52.0)
Hemoglobin: 15.2 g/dL (ref 13.0–17.0)
MCH: 28.1 pg (ref 26.0–34.0)
MCHC: 33 g/dL (ref 30.0–36.0)
MCV: 85.2 fL (ref 80.0–100.0)
Platelets: 214 10*3/uL (ref 150–400)
RBC: 5.41 MIL/uL (ref 4.22–5.81)
RDW: 14.1 % (ref 11.5–15.5)
WBC: 6.7 10*3/uL (ref 4.0–10.5)
nRBC: 0 % (ref 0.0–0.2)

## 2023-03-25 LAB — CBG MONITORING, ED
Glucose-Capillary: 63 mg/dL — ABNORMAL LOW (ref 70–99)
Glucose-Capillary: 85 mg/dL (ref 70–99)

## 2023-03-25 MED ORDER — SODIUM CHLORIDE 0.9 % IV BOLUS
1000.0000 mL | Freq: Once | INTRAVENOUS | Status: AC
  Administered 2023-03-25: 1000 mL via INTRAVENOUS

## 2023-03-25 MED ORDER — KETOROLAC TROMETHAMINE 15 MG/ML IJ SOLN
15.0000 mg | Freq: Once | INTRAMUSCULAR | Status: AC
  Administered 2023-03-25: 15 mg via INTRAVENOUS
  Filled 2023-03-25: qty 1

## 2023-03-25 MED ORDER — PROCHLORPERAZINE EDISYLATE 10 MG/2ML IJ SOLN
10.0000 mg | Freq: Once | INTRAMUSCULAR | Status: AC
  Administered 2023-03-25: 10 mg via INTRAVENOUS
  Filled 2023-03-25: qty 2

## 2023-03-25 MED ORDER — DIPHENHYDRAMINE HCL 50 MG/ML IJ SOLN
25.0000 mg | Freq: Once | INTRAMUSCULAR | Status: AC
  Administered 2023-03-25: 25 mg via INTRAVENOUS
  Filled 2023-03-25: qty 1

## 2023-03-25 NOTE — ED Triage Notes (Addendum)
Pt here with dizziness x1 month. Pt states he has been seen at several facilities and several scans with no diagnosis besides cysts. Pt concerned about the possibility of MS. Pt states he is not able to work due to his symptoms. Pt had a MRI of his eyes last month. Pt is on Ozempic as well.

## 2023-03-25 NOTE — Discharge Instructions (Addendum)
You were seen in the emergency department today for evaluation of your dizziness.  Your lab work did not show any severe abnormalities and your exam was overall reassuring.  As we discussed, I do recommend that you continue to follow-up as an outpatient for further evaluation.  I have included information for neurology group above.  Return to the ER for any new or worsening symptoms.

## 2023-03-25 NOTE — ED Provider Notes (Signed)
[facility] Provider Note    Event Date/Time   First MD Initiated Contact with Patient 03/25/23 1637     (approximate)   History   Dizziness   HPI  William Hall is a 52 y.o. male   presenting to the emergency department for a month of dizziness.  Dizziness is not described as a spinning sensation or lightheadedness.  He does report associated blurred vision that has not changed in the past couple days.  Does feel that he occasionally has some lightheadedness associated with this.  He has had multiple visits including urgent care, ER, with his primary care doctor, ophthalmology, and ENT for the same.  His workup has included an MRI of the orbits that demonstrated paranasal sinus disease, otherwise unrevealing.  He is currently awaiting outpatient follow-up with neurology and an outpatient MRI brain.  He denies any focal numbness, tingling, weakness.  Additionally reports a mild to moderate headache, intermittent associated with symptoms.  No fevers.  No chest pain or shortness of breath.  No syncope.  He has multiple family members with a history of MS and is very concerned that he may have this.    Physical Exam   Triage Vital Signs: ED Triage Vitals  Enc Vitals Group     BP 03/25/23 1450 (!) 154/108     Pulse Rate 03/25/23 1450 (!) 107     Resp 03/25/23 1450 18     Temp 03/25/23 1450 98.4 F (36.9 C)     Temp Source 03/25/23 1450 Oral     SpO2 03/25/23 1450 99 %     Weight 03/25/23 1450 257 lb 15 oz (117 kg)     Height 03/25/23 1450 6' (1.829 m)     Head Circumference --      Peak Flow --      Pain Score 03/25/23 1912 0     Pain Loc --      Pain Edu? --      Excl. in GC? --     Most recent vital signs: Vitals:   03/25/23 1730 03/25/23 1912  BP: (!) 150/102 115/76  Pulse: 72 67  Resp: 14 14  Temp:  (!) 97.5 F (36.4 C)  SpO2: 99% 97%     General: Awake, interactive  CV:  Regular rate, good peripheral perfusion.  Resp:  Lungs clear,  unlabored respirations.  Abd:  Soft, nondistended.  Neuro:  Alert and oriented, normal extraocular movements, 20/70 vision in the right eye, 20/40 vision in the left eye, patient declines intraocular pressure testing as he reports he had this done at the ophthalmologist recently and was reassuring, no visual field cut, sensation intact over the face and bilateral upper and lower extremities.  5 out of 5 strength of the bilateral upper and lower extremities.  Normal finger-nose testing.  Negative Romberg.  ED Results / Procedures / Treatments   Labs (all labs ordered are listed, but only abnormal results are displayed) Labs Reviewed  BASIC METABOLIC PANEL - Abnormal; Notable for the following components:      Result Value   Potassium 3.3 (*)    Glucose, Bld 139 (*)    All other components within normal limits  URINALYSIS, ROUTINE W REFLEX MICROSCOPIC - Abnormal; Notable for the following components:   Color, Urine YELLOW (*)    APPearance CLEAR (*)    All other components within normal limits  CBG MONITORING, ED - Abnormal; Notable for the following components:   Glucose-Capillary  63 (*)    All other components within normal limits  CBC  CBG MONITORING, ED     EKG EKG independently reviewed interpreted by myself (ER attending) demonstrates:    RADIOLOGY Imaging independently reviewed and interpreted by myself demonstrates:    PROCEDURES:  Critical Care performed: No  Procedures   MEDICATIONS ORDERED IN ED: Medications  sodium chloride 0.9 % bolus 1,000 mL (0 mLs Intravenous Stopped 03/25/23 1912)  diphenhydrAMINE (BENADRYL) injection 25 mg (25 mg Intravenous Given 03/25/23 1819)  ketorolac (TORADOL) 15 MG/ML injection 15 mg (15 mg Intravenous Given 03/25/23 1814)  prochlorperazine (COMPAZINE) injection 10 mg (10 mg Intravenous Given 03/25/23 1817)     IMPRESSION / MDM / ASSESSMENT AND PLAN / ED COURSE  I reviewed the triage vital signs and the nursing  notes.  Differential diagnosis includes, but is not limited to, vertigo, anemia, electrolyte abnormality, dehydration, benign headache, consideration for more significant acute process, but overall lower suspicion given reassuring neurologic exam  Patient's presentation is most consistent with acute presentation with potential threat to life or bodily function.  52 year old male presenting to the emergency department for evaluation of dizziness.  Symptoms ongoing for over a month with multiple outpatient evaluations.  Has previously had a head CT as well as an MRI of the orbit without emergency findings.  Had extensive discussion with the patient regarding his presentation.  He fortunately has a normal neurologic exam here.  I did review his MRI of the orbit which did obtain imaging of a large portion of the intracranial region without acute findings though it was a motion degraded exam.  I do not think there is an indication for further imaging currently in the emergency department.  He does have outpatient follow-up for further evaluation with plan to see neurology.  Did trial a migraine cocktail with limited improvement in his symptoms.  However, do not think there is an indication for further ER workup at this time.  Patient comfortable with this plan.  Strict return precautions provided.     FINAL CLINICAL IMPRESSION(S) / ED DIAGNOSES   Final diagnoses:  Dizziness     Rx / DC Orders   ED Discharge Orders     None        Note:  This document was prepared using Dragon voice recognition software and may include unintentional dictation errors.   Trinna Post, MD 03/25/23 [phone]

## 2023-03-25 NOTE — ED Notes (Signed)
Pt reports feeling "woozy, weird and congested" after receiving the compazine medication.

## 2023-03-26 ENCOUNTER — Encounter: Payer: Self-pay | Admitting: Otolaryngology

## 2023-03-26 ENCOUNTER — Other Ambulatory Visit: Payer: Self-pay | Admitting: Otolaryngology

## 2023-03-26 DIAGNOSIS — R42 Dizziness and giddiness: Secondary | ICD-10-CM

## 2023-04-01 ENCOUNTER — Other Ambulatory Visit: Payer: 59

## 2023-04-06 ENCOUNTER — Ambulatory Visit
Admission: RE | Admit: 2023-04-06 | Discharge: 2023-04-06 | Disposition: A | Discharge status: Home / Self Care | Payer: 59 | Source: Ambulatory Visit | Attending: Otolaryngology | Admitting: Otolaryngology

## 2023-04-06 DIAGNOSIS — R42 Dizziness and giddiness: Secondary | ICD-10-CM | POA: Diagnosis present

## 2023-04-06 MED ORDER — GADOBUTROL 1 MMOL/ML IV SOLN
10.0000 mL | Freq: Once | INTRAVENOUS | Status: AC | PRN
  Administered 2023-04-06: 10 mL via INTRAVENOUS

## 2023-04-18 ENCOUNTER — Other Ambulatory Visit: Payer: 59

## 2023-10-12 ENCOUNTER — Encounter: Payer: Self-pay | Admitting: Emergency Medicine

## 2023-10-12 ENCOUNTER — Ambulatory Visit
Admission: EM | Admit: 2023-10-12 | Discharge: 2023-10-12 | Disposition: A | Discharge status: Home / Self Care | Payer: 59 | Attending: Emergency Medicine | Admitting: Emergency Medicine

## 2023-10-12 ENCOUNTER — Ambulatory Visit (INDEPENDENT_AMBULATORY_CARE_PROVIDER_SITE_OTHER): Payer: 59

## 2023-10-12 DIAGNOSIS — R051 Acute cough: Secondary | ICD-10-CM | POA: Diagnosis not present

## 2023-10-12 DIAGNOSIS — J069 Acute upper respiratory infection, unspecified: Secondary | ICD-10-CM | POA: Diagnosis not present

## 2023-10-12 DIAGNOSIS — J209 Acute bronchitis, unspecified: Secondary | ICD-10-CM

## 2023-10-12 DIAGNOSIS — Z20822 Contact with and (suspected) exposure to covid-19: Secondary | ICD-10-CM

## 2023-10-12 LAB — SARS CORONAVIRUS 2 BY RT PCR: SARS Coronavirus 2 by RT PCR: NEGATIVE

## 2023-10-12 LAB — GROUP A STREP BY PCR: Group A Strep by PCR: NOT DETECTED

## 2023-10-12 MED ORDER — PROMETHAZINE-DM 6.25-15 MG/5ML PO SYRP: 5.0000 mL | ORAL_SOLUTION | Freq: Four times a day (QID) | ORAL | 0 refills | Status: DC | PRN

## 2023-10-12 MED ORDER — PREDNISONE 20 MG PO TABS: 40.0000 mg | ORAL_TABLET | Freq: Every day | ORAL | 0 refills | Status: AC

## 2023-10-12 MED ORDER — FLUTICASONE PROPIONATE 50 MCG/ACT NA SUSP: 2.0000 | Freq: Every day | NASAL | 0 refills | Status: DC

## 2023-10-12 MED ORDER — ALBUTEROL SULFATE HFA 108 (90 BASE) MCG/ACT IN AERS: 1.0000 | INHALATION_SPRAY | RESPIRATORY_TRACT | 0 refills | Status: DC | PRN

## 2023-10-12 MED ORDER — AEROCHAMBER MV MISC: 1 refills | Status: DC

## 2023-10-12 NOTE — ED Triage Notes (Signed)
 Patient c/o sore throat, cough, chest congestion, nasal congestion that started 10/08/23.  Patient unsure of fevers.

## 2023-10-12 NOTE — ED Provider Notes (Addendum)
 HPI  SUBJECTIVE:  William Hall is a 53 y.o. male who presents with 4 days of nasal congestion, cough productive of brown sputum, wheezing, chest tightness, chest congestion and shortness of breath.  He reports body aches, sore throat, sinus pain and pressure, postnasal drip.  States that his chest feels sore from coughing and that it feels heavy when he is lying down.  There is no exertional component to this chest pain.  No fevers, headaches, facial swelling, upper dental pain, dyspnea on exertion, nausea, vomiting, diarrhea, abdominal pain.  No pleuritic pain. no known COVID, flu, strep exposure.  He got 2 doses of the COVID-vaccine and this years flu vaccine.  He is unable to sleep at night because of the cough.  No antibiotics in the past 3 months.  No antipyretic in the past 6 hours.  He has tried DayQuil and sleeping upright without improvement in his symptoms.  Symptoms are worse with lying flat.  He has a past medical history of prediabetes, hypertension, and has an albuterol  inhaler at home for reactive airway disease.  No history of asthma, smoking.  PCP: [facility].    Past Medical History:  Diagnosis Date   Diabetes mellitus without complication (HCC)    GERD (gastroesophageal reflux disease)     History reviewed. No pertinent surgical history.  Family History  Problem Relation Age of Onset   Diabetes Mother    Cancer Father     Social History   Tobacco Use   Smoking status: Never   Smokeless tobacco: Never  Vaping Use   Vaping status: Never Used  Substance Use Topics   Alcohol use: No   Drug use: No    No current facility-administered medications for this encounter.  Current Outpatient Medications:    albuterol  (VENTOLIN  HFA) 108 (90 Base) MCG/ACT inhaler, Inhale 1-2 puffs into the lungs every 4 (four) hours as needed for wheezing or shortness of breath., Disp: 1 each, Rfl: 0   fluticasone  (FLONASE ) 50 MCG/ACT nasal spray, Place 2 sprays into both nostrils  daily., Disp: 16 g, Rfl: 0   predniSONE  (DELTASONE ) 20 MG tablet, Take 2 tablets (40 mg total) by mouth daily with breakfast for 5 days., Disp: 10 tablet, Rfl: 0   promethazine -dextromethorphan (PROMETHAZINE -DM) 6.25-15 MG/5ML syrup, Take 5 mLs by mouth 4 (four) times daily as needed for cough., Disp: 118 mL, Rfl: 0   Spacer/Aero-Holding Chambers (AEROCHAMBER MV) inhaler, Use as instructed, Disp: 1 each, Rfl: 1   hydrochlorothiazide (HYDRODIURIL) 25 MG tablet, Take 25 mg by mouth daily., Disp: , Rfl:    metFORMIN (GLUCOPHAGE) 500 MG tablet, SMARTSIG:1 Tablet(s) By Mouth Every Evening, Disp: , Rfl:    omeprazole (PRILOSEC) 20 MG capsule, Take 20 mg by mouth daily., Disp: , Rfl:    OZEMPIC, 2 MG/DOSE, 8 MG/3ML SOPN, Inject 2 mg into the skin once a week., Disp: , Rfl:   Allergies  Allergen Reactions   Metformin Diarrhea   Levonorgestrel-Ethinyl Estrad    Topiramate     Other reaction(s): Dizziness And mental status changes And mental status changes      ROS  As noted in HPI.   Physical Exam  BP (!) 151/89 (BP Location: Right Arm) Comment: Patinet was not taken his BP medicine today  Pulse 69   Temp 98.4 F (36.9 C) (Oral)   Resp 16   Ht 6' (1.829 m)   Wt 117.9 kg   SpO2 96%   BMI 35.26 kg/m   Constitutional: Well  developed, well nourished, no acute distress Eyes:  EOMI, conjunctiva normal bilaterally HENT: Normocephalic, atraumatic,mucus membranes moist.  TMs normal.  Extensive clear nasal congestion.  Erythematous, swollen turbinates.  No maxillary, frontal sinus tenderness.  Normal oropharynx.  No postnasal drip. Neck: Positive cervical lymphadenopathy Respiratory: Normal inspiratory effort, wheezing right side.  No anterior, lateral chest wall tenderness. Cardiovascular: Normal rate, regular rhythm, no murmurs rubs or gallops GI: nondistended skin: No rash, skin intact Musculoskeletal: no deformities Neurologic: Alert & oriented x 3, no focal neuro  deficits Psychiatric: Speech and behavior appropriate   ED Course   Medications - No data to display  Orders Placed This Encounter  Procedures   Group A Strep by PCR    Standing Status:   Standing    Number of Occurrences:   1   SARS Coronavirus 2 by RT PCR (hospital order, performed in [facility] hospital lab) *cepheid single result test* Anterior Nasal Swab    Standing Status:   Standing    Number of Occurrences:   1   DG Chest 2 View    Standing Status:   Standing    Number of Occurrences:   1    Reason for Exam (SYMPTOM  OR DIAGNOSIS REQUIRED):   cough focla lung findings wheeze R side r/o PNA    Results for orders placed or performed during the hospital encounter of 10/12/23 (from the past 24 hours)  Group A Strep by PCR     Status: None   Collection Time: 10/12/23  8:22 AM   Specimen: Throat; Sterile Swab  Result Value Ref Range   Group A Strep by PCR NOT DETECTED NOT DETECTED  SARS Coronavirus 2 by RT PCR (hospital order, performed in [facility] hospital lab) *cepheid single result test* Anterior Nasal Swab     Status: None   Collection Time: 10/12/23  8:22 AM   Specimen: Anterior Nasal Swab  Result Value Ref Range   SARS Coronavirus 2 by RT PCR NEGATIVE NEGATIVE   DG Chest 2 View Result Date: 10/12/2023 CLINICAL DATA:  53 year old male with history of right-sided wheezing. Evaluate for pneumonia. EXAM: CHEST - 2 VIEW COMPARISON:  Chest x-ray 10/24/2005. FINDINGS: Lung volumes are normal. No consolidative airspace disease. No pleural effusions. No pneumothorax. No pulmonary nodule or mass noted. Pulmonary vasculature and the cardiomediastinal silhouette are within normal limits. IMPRESSION: No radiographic evidence of acute cardiopulmonary disease. Electronically Signed   By: Toribio Aye M.D.   On: 10/12/2023 09:22    ED Clinical Impression  1. Viral URI with cough   2. Acute cough   3. Lab test negative for COVID-19 virus   4. Acute bronchitis, unspecified  organism      ED Assessment/Plan     Patient presents with an upper respiratory infection.  Strep, COVID negative.  Getting an x-ray due to focal wheezing on the right side and a positive x-ray would change management.  Reviewed imaging independently. No pneumonia as read by me.  Formal radiology report pending.  Will contact patient [phone] if over read differs enough from mine and we need to change management.  Reviewed radiology report.  No acute cardiopulmonary disease consistent with my read.  See radiology report for full details.  Suspected wheezing and chest tightness is from bronchospasm and increased inspiratory effort.  Doubt pericarditis.  Home with regularly scheduled albuterol  inhaler with a spacer for 4 days, then as needed thereafter, prednisone  40 mg for 5 days, Promethazine  DM, Mucinex D -  he will monitor his blood pressure.  If this elevates it, then he will switch to plain Mucinex.  Flonase , saline nasal irrigation.  Follow-up with PCP as needed.  Discussed labs, imaging, MDM, treatment plan, and plan for follow-up with patient. patient agrees with plan.   Meds ordered this encounter  Medications   albuterol  (VENTOLIN  HFA) 108 (90 Base) MCG/ACT inhaler    Sig: Inhale 1-2 puffs into the lungs every 4 (four) hours as needed for wheezing or shortness of breath.    Dispense:  1 each    Refill:  0   fluticasone  (FLONASE ) 50 MCG/ACT nasal spray    Sig: Place 2 sprays into both nostrils daily.    Dispense:  16 g    Refill:  0   predniSONE  (DELTASONE ) 20 MG tablet    Sig: Take 2 tablets (40 mg total) by mouth daily with breakfast for 5 days.    Dispense:  10 tablet    Refill:  0   Spacer/Aero-Holding Chambers (AEROCHAMBER MV) inhaler    Sig: Use as instructed    Dispense:  1 each    Refill:  1   promethazine -dextromethorphan (PROMETHAZINE -DM) 6.25-15 MG/5ML syrup    Sig: Take 5 mLs by mouth 4 (four) times daily as needed for cough.    Dispense:  118 mL     Refill:  0      *This clinic note was created using Scientist, clinical (histocompatibility and immunogenetics). Therefore, there may be occasional mistakes despite careful proofreading.  ?    Van Knee, MD 10/13/23 1152    Van Knee, MD 10/13/23 1154

## 2023-10-12 NOTE — Discharge Instructions (Signed)
 Your chest x-ray was negative for pneumonia as read by me and radiology.SABRA  COVID, strep testing negative.  Take two puffs from your albuterol  inhaler every 4 hours for 2 days, then every 6 hours for 2 days, then as needed. You can back off if you start to improve  sooner. Finish the steroids unless your doctor tells you to stop.  Take Naprosyn 440 mg combined with 1000 mg of Tylenol twice daily as needed for pain.  This is an effective combination for pain and fever.  Mucinex the if your blood pressure stays under reasonable control, if not, then switch to plain Mucinex.  Flonase , saline nasal irrigation with a NeilMed sinus rinse with distilled water as often as you want to help prevent of bacterial sinus infection.  He can use Afrin at night for up to 3 days to help with the nasal congestion so he can breathe at night.  Promethazine  DM as needed for cough.  Make sure you drink extra fluids. Return if you get worse, or not better after 10 days of being sick total have a fever >100.4, or any other concerns.   If the spacer is too expensive at the pharmacy, you can get an AeroChamber Z-Stat off of [employer] for about $10-$15.  Go to www.goodrx.com  or www.costplusdrugs.com to look up your medications. This will give you a list of where you can find your prescriptions at the most affordable prices. Or ask the pharmacist what the cash price is, or if they have any other discount programs available to help make your medication more affordable. This can be less expensive than what you would pay with insurance.

## 2023-12-12 ENCOUNTER — Ambulatory Visit (INDEPENDENT_AMBULATORY_CARE_PROVIDER_SITE_OTHER)

## 2023-12-12 ENCOUNTER — Ambulatory Visit: Admission: EM | Admit: 2023-12-12 | Discharge: 2023-12-12 | Disposition: A | Discharge status: Home / Self Care

## 2023-12-12 DIAGNOSIS — R051 Acute cough: Secondary | ICD-10-CM

## 2023-12-12 DIAGNOSIS — J069 Acute upper respiratory infection, unspecified: Secondary | ICD-10-CM | POA: Diagnosis not present

## 2023-12-12 MED ORDER — BENZONATATE 100 MG PO CAPS: 200.0000 mg | ORAL_CAPSULE | Freq: Three times a day (TID) | ORAL | 0 refills | Status: DC

## 2023-12-12 MED ORDER — PROMETHAZINE-DM 6.25-15 MG/5ML PO SYRP
5.0000 mL | ORAL_SOLUTION | Freq: Four times a day (QID) | ORAL | 0 refills | Status: DC | PRN
Start: 2023-12-12 — End: 2024-05-24

## 2023-12-12 MED ORDER — AMOXICILLIN-POT CLAVULANATE 875-125 MG PO TABS: 1.0000 | ORAL_TABLET | Freq: Two times a day (BID) | ORAL | 0 refills | Status: AC

## 2023-12-12 MED ORDER — ALBUTEROL SULFATE HFA 108 (90 BASE) MCG/ACT IN AERS: 1.0000 | INHALATION_SPRAY | RESPIRATORY_TRACT | 0 refills | Status: DC | PRN

## 2023-12-12 MED ORDER — IPRATROPIUM BROMIDE 0.06 % NA SOLN: 2.0000 | Freq: Four times a day (QID) | NASAL | 12 refills | Status: DC

## 2023-12-12 NOTE — ED Triage Notes (Signed)
 Pt c/o chest tightness, nasal congestion x5days  Pt was diagnosed with acute bronchitis in January and believes he has the same thing.   Pt states that he was seen after his visit with [place] in January, was diagnosed with RSV and given a steroid injection - solu medrol

## 2023-12-12 NOTE — Discharge Instructions (Signed)
 Take the Augmentin 875 mg twice daily with food for 7 days for treatment of your upper respiratory infection.  Use over-the-counter Tylenol and/or ibuprofen according to pack instructions and if any fever or pain.  Use the albuterol inhaler, with the spacer, 1 to 2 puffs every 4-6 hours as needed for your chest tightness.  Use the Atrovent nasal spray, 2 squirts in each nostril every 6 hours, as needed for runny nose and postnasal drip.  Use the Tessalon Perles every 8 hours during the day.  Take them with a small sip of water.  They may give you some numbness to the base of your tongue or a metallic taste in your mouth, this is normal.  Use the Promethazine DM cough syrup at bedtime for cough and congestion.  It will make you drowsy so do not take it during the day.  Continue to take your blood pressure medication as prescribed.  Purchase a blood pressure cuff and check your blood pressure at home periodically throughout the day.  When you take your blood pressure make sure that you have been sitting in a relaxed state for at least 30 minutes, your bladder is empty, your feet are flat on the floor, and the arm you are will not take the reading and has been at heart level for at least 5 minutes.  Take the reading and record it.  If these readings consistently run higher than 140/90 at home we need to make an appointment with your primary care provider to discuss your antihypertensive regimen as she may need an increase in her dosing.  Return for reevaluation or see your primary care provider for any new or worsening symptoms.

## 2023-12-12 NOTE — ED Provider Notes (Addendum)
 [facility]    CSN: [id number] Arrival date & time: 12/12/23  0919      History   Chief Complaint Chief Complaint  Patient presents with   Shortness of Breath         HPI William Hall is a 53 y.o. male.   HPI  53 year old male with past medical history significant for OSA on CPAP, hypertension, diabetes, and GERD presents for evaluation of 5 days worth of nasal congestion with yellow nasal discharge, chest tightness, and intermittent productive cough.  He denies any fever, shortness with, or wheezing.  His blood pressure is markedly elevated at 177/151.  He reports that he did take his antihypertensive this morning.  He denies any headache, chest pain, or blurry vision.  Past Medical History:  Diagnosis Date   Diabetes mellitus without complication (HCC)    GERD (gastroesophageal reflux disease)     Patient Active Problem List   Diagnosis Date Noted   Hypertension 07/23/2022   OSA on CPAP 08/07/2020   Gastroesophageal reflux disease without esophagitis 08/07/2020    History reviewed. No pertinent surgical history.     Home Medications    Prior to Admission medications   Medication Sig Start Date End Date Taking? Authorizing Provider  amLODipine (NORVASC) 5 MG tablet Take 1 tablet by mouth daily. 04/18/23 11/02/24 Yes [provider]  amoxicillin-clavulanate (AUGMENTIN) 875-125 MG tablet Take 1 tablet by mouth every 12 (twelve) hours for 7 days. 12/12/23 12/19/23 Yes Becky Augusta, NP  benzonatate (TESSALON) 100 MG capsule Take 2 capsules (200 mg total) by mouth every 8 (eight) hours. 12/12/23  Yes Becky Augusta, NP  hydrochlorothiazide (HYDRODIURIL) 25 MG tablet Take 25 mg by mouth daily.   Yes [provider]  ipratropium (ATROVENT) 0.06 % nasal spray Place 2 sprays into both nostrils 4 (four) times daily. 12/12/23  Yes Becky Augusta, NP  metFORMIN (GLUCOPHAGE) 500 MG tablet SMARTSIG:1 Tablet(s) By Mouth Every Evening 04/16/22  Yes [provider]  omeprazole (PRILOSEC) 20 MG capsule Take 20 mg by mouth daily.   Yes [provider]  OZEMPIC, 2 MG/DOSE, 8 MG/3ML SOPN Inject 2 mg into the skin once a week. 06/13/22  Yes [provider]  albuterol (VENTOLIN HFA) 108 (90 Base) MCG/ACT inhaler Inhale 1-2 puffs into the lungs every 4 (four) hours as needed for wheezing or shortness of breath. 12/12/23   Becky Augusta, NP  promethazine-dextromethorphan (PROMETHAZINE-DM) 6.25-15 MG/5ML syrup Take 5 mLs by mouth 4 (four) times daily as needed for cough. 12/12/23   Becky Augusta, NP  Spacer/Aero-Holding Deretha Emory (AEROCHAMBER MV) inhaler Use as instructed 10/12/23   Domenick Gong, MD  pantoprazole (PROTONIX) 20 MG tablet Take 20 mg by mouth daily.  07/27/19  [provider]    Family History Family History  Problem Relation Age of Onset   Diabetes Mother    Cancer Father     Social History Social History   Tobacco Use   Smoking status: Never   Smokeless tobacco: Never  Vaping Use   Vaping status: Never Used  Substance Use Topics   Alcohol use: No   Drug use: No     Allergies   Metformin, Levonorgestrel-ethinyl estrad, and Topiramate   Review of Systems Review of Systems  Constitutional:  Negative for fever.  HENT:  Positive for congestion and rhinorrhea.   Eyes:  Negative for visual disturbance.  Respiratory:  Positive for cough and chest tightness. Negative for shortness of breath and wheezing.  Cardiovascular:  Negative for chest pain.  Neurological:  Negative for headaches.     Physical Exam Triage Vital Signs ED Triage Vitals  Encounter Vitals Group     BP --      Systolic BP Percentile --      Diastolic BP Percentile --      Pulse --      Resp --      Temp --      Temp src --      SpO2 --      Weight 12/12/23 1052 264 lb (119.7 kg)     Height 12/12/23 1052 6' (1.829 m)     Head Circumference --      Peak Flow --      Pain Score 12/12/23 1051 6     Pain Loc --       Pain Education --      Exclude from Growth Chart --    No data found.  Updated Vital Signs BP (!) (P) 177/151 (BP Location: Left Arm)   Pulse (P) 74   Temp (P) 98.2 F (36.8 C) (Oral)   Ht 6' (1.829 m)   Wt 264 lb (119.7 kg)   SpO2 (P) 100%   BMI 35.80 kg/m   Visual Acuity Right Eye Distance:   Left Eye Distance:   Bilateral Distance:    Right Eye Near:   Left Eye Near:    Bilateral Near:     Physical Exam Vitals and nursing note reviewed.  Constitutional:      Appearance: Normal appearance. He is not ill-appearing.  HENT:     Head: Normocephalic and atraumatic.     Right Ear: Tympanic membrane, ear canal and external ear normal. There is no impacted cerumen.     Left Ear: Tympanic membrane, ear canal and external ear normal. There is no impacted cerumen.     Nose: Congestion and rhinorrhea present.     Comments: Nasal mucosa is erythematous and edematous with scant clear discharge in both nares.    Mouth/Throat:     Mouth: Mucous membranes are moist.     Pharynx: Oropharynx is clear. No oropharyngeal exudate or posterior oropharyngeal erythema.  Cardiovascular:     Rate and Rhythm: Normal rate and regular rhythm.     Pulses: Normal pulses.     Heart sounds: Normal heart sounds. No murmur heard.    No friction rub. No gallop.  Pulmonary:     Effort: Pulmonary effort is normal.     Breath sounds: Normal breath sounds. No wheezing, rhonchi or rales.  Musculoskeletal:     Cervical back: Normal range of motion and neck supple. No tenderness.  Lymphadenopathy:     Cervical: No cervical adenopathy.  Skin:    General: Skin is warm and dry.     Capillary Refill: Capillary refill takes less than 2 seconds.     Findings: No rash.  Neurological:     General: No focal deficit present.     Mental Status: He is alert and oriented to person, place, and time.      UC Treatments / Results  Labs (all labs ordered are listed, but only abnormal results are displayed) Labs  Reviewed - No data to display  EKG   Radiology No results found.  Procedures Procedures (including critical care time)  Medications Ordered in UC Medications - No data to display  Initial Impression / Assessment and Plan / UC Course  I have reviewed the triage vital  signs and the nursing notes.  Pertinent labs & imaging results that were available during my care of the patient were reviewed by me and considered in my medical decision making (see chart for details).   Patient is a nontoxic-appearing 53 year old male presenting for evaluation of 5 days worth of respiratory symptoms outlined HPI above.  He reports that he feels like his chest is tight and he is intermittently producing yellow sputum.  In the exam room he is able to speak in full sentence without dyspnea or tachypnea.  Room air oxygen saturation is 100%.  His cardiopulmonary exam reveals clear lung sounds in all fields.  I will order a chest x-ray to evaluate for any acute cardiopulmonary pathology.  Patient's blood pressure is also markedly elevated at 177/151.  He reports that he did take his antihypertensives this morning.  He also reports that he does not monitor his blood pressure at home.  I will have staff recheck his blood pressure manually.  Chest x-ray independent reviewed and evaluated by me.  Impression: Lung fields are well aerated without evidence of infiltrate or effusion.  Cardiomediastinal silhouette appears normal.  Radiology read is pending. Radiology impression states no active cardiopulmonary disease.  I will discharge patient with a diagnosis of URI with cough and congestion on Augmentin 875 mg twice daily for 7 days with food.  Atrovent nasal spray for nasal congestion.  Tessalon Perles and [place] DM cough syrup for cough and congestion.  I will also prescribe an albuterol inhaler and spacer and he can take 1 to 2 puffs every 4-6 hours as needed for chest tightness.   Final Clinical Impressions(s) / UC  Diagnoses   Final diagnoses:  Acute cough  URI with cough and congestion     Discharge Instructions      Take the Augmentin 875 mg twice daily with food for 7 days for treatment of your upper respiratory infection.  Use over-the-counter Tylenol and/or ibuprofen according to pack instructions and if any fever or pain.  Use the albuterol inhaler, with the spacer, 1 to 2 puffs every 4-6 hours as needed for your chest tightness.  Use the Atrovent nasal spray, 2 squirts in each nostril every 6 hours, as needed for runny nose and postnasal drip.  Use the Tessalon Perles every 8 hours during the day.  Take them with a small sip of water.  They may give you some numbness to the base of your tongue or a metallic taste in your mouth, this is normal.  Use the Promethazine DM cough syrup at bedtime for cough and congestion.  It will make you drowsy so do not take it during the day.  Continue to take your blood pressure medication as prescribed.  Purchase a blood pressure cuff and check your blood pressure at home periodically throughout the day.  When you take your blood pressure make sure that you have been sitting in a relaxed state for at least 30 minutes, your bladder is empty, your feet are flat on the floor, and the arm you are will not take the reading and has been at heart level for at least 5 minutes.  Take the reading and record it.  If these readings consistently run higher than 140/90 at home we need to make an appointment with your primary care provider to discuss your antihypertensive regimen as she may need an increase in her dosing.  Return for reevaluation or see your primary care provider for any new or worsening symptoms.  ED Prescriptions     Medication Sig Dispense Auth. Provider   albuterol (VENTOLIN HFA) 108 (90 Base) MCG/ACT inhaler Inhale 1-2 puffs into the lungs every 4 (four) hours as needed for wheezing or shortness of breath. 1 each Becky Augusta, NP    promethazine-dextromethorphan (PROMETHAZINE-DM) 6.25-15 MG/5ML syrup Take 5 mLs by mouth 4 (four) times daily as needed for cough. 118 mL Becky Augusta, NP   benzonatate (TESSALON) 100 MG capsule Take 2 capsules (200 mg total) by mouth every 8 (eight) hours. 21 capsule Becky Augusta, NP   ipratropium (ATROVENT) 0.06 % nasal spray Place 2 sprays into both nostrils 4 (four) times daily. 15 mL Becky Augusta, NP   amoxicillin-clavulanate (AUGMENTIN) 875-125 MG tablet Take 1 tablet by mouth every 12 (twelve) hours for 7 days. 14 tablet Becky Augusta, NP      PDMP not reviewed this encounter.   Becky Augusta, NP 12/12/23 1136    Becky Augusta, NP 12/12/23 1308

## 2024-01-12 ENCOUNTER — Other Ambulatory Visit: Payer: Self-pay | Admitting: Cardiology

## 2024-01-12 DIAGNOSIS — R42 Dizziness and giddiness: Secondary | ICD-10-CM

## 2024-01-12 DIAGNOSIS — I10 Essential (primary) hypertension: Secondary | ICD-10-CM

## 2024-01-12 DIAGNOSIS — R0789 Other chest pain: Secondary | ICD-10-CM

## 2024-01-16 ENCOUNTER — Telehealth (HOSPITAL_COMMUNITY): Payer: Self-pay | Admitting: *Deleted

## 2024-01-16 MED ORDER — METOPROLOL TARTRATE 100 MG PO TABS: ORAL_TABLET | ORAL | 0 refills | Status: AC

## 2024-01-16 NOTE — Telephone Encounter (Signed)
 Attempted to call patient regarding upcoming cardiac CT appointment. Left message on voicemail with name and callback number Johney Frame RN Navigator Cardiac Imaging [facility] [phone] Office

## 2024-01-16 NOTE — Telephone Encounter (Signed)
 Patient returning call upcoming cardiac imaging study; pt verbalizes understanding of appt date/time, parking situation and where to check in, pre-test NPO status and medications ordered, and verified current allergies; name and call back number provided for further questions should they arise  Larey Brick RN Navigator Cardiac Imaging [facility] [phone] office [phone] cell  Patient to take 100mg  metoprolol tartrate two hours prior to his cardiac CT scan.

## 2024-01-19 ENCOUNTER — Ambulatory Visit
Admission: RE | Admit: 2024-01-19 | Discharge: 2024-01-19 | Disposition: A | Discharge status: Home / Self Care | Source: Ambulatory Visit | Attending: Cardiology | Admitting: Cardiology

## 2024-01-19 DIAGNOSIS — R42 Dizziness and giddiness: Secondary | ICD-10-CM | POA: Insufficient documentation

## 2024-01-19 DIAGNOSIS — I10 Essential (primary) hypertension: Secondary | ICD-10-CM | POA: Diagnosis present

## 2024-01-19 DIAGNOSIS — R0789 Other chest pain: Secondary | ICD-10-CM | POA: Insufficient documentation

## 2024-01-19 MED ORDER — DILTIAZEM HCL 25 MG/5ML IV SOLN: 10.0000 mg | INTRAVENOUS | Status: DC | PRN

## 2024-01-19 MED ORDER — NITROGLYCERIN 0.4 MG SL SUBL
0.8000 mg | SUBLINGUAL_TABLET | Freq: Once | SUBLINGUAL | Status: AC
  Administered 2024-01-19: 0.8 mg via SUBLINGUAL
  Filled 2024-01-19: qty 25

## 2024-01-19 MED ORDER — NITROGLYCERIN 0.4 MG SL SUBL
SUBLINGUAL_TABLET | SUBLINGUAL | Status: AC
  Filled 2024-01-19: qty 2

## 2024-01-19 MED ORDER — METOPROLOL TARTRATE 5 MG/5ML IV SOLN
10.0000 mg | Freq: Once | INTRAVENOUS | Status: DC | PRN
  Filled 2024-01-19: qty 10

## 2024-01-19 MED ORDER — IOHEXOL 350 MG/ML SOLN
80.0000 mL | Freq: Once | INTRAVENOUS | Status: AC | PRN
  Administered 2024-01-19: 100 mL via INTRAVENOUS

## 2024-02-10 NOTE — Progress Notes (Signed)
 No show

## 2024-02-11 ENCOUNTER — Ambulatory Visit: Admitting: Internal Medicine

## 2024-05-24 ENCOUNTER — Ambulatory Visit
Admission: EM | Admit: 2024-05-24 | Discharge: 2024-05-24 | Disposition: A | Discharge status: Home / Self Care | Attending: Emergency Medicine | Admitting: Emergency Medicine

## 2024-05-24 DIAGNOSIS — J0100 Acute maxillary sinusitis, unspecified: Secondary | ICD-10-CM | POA: Diagnosis not present

## 2024-05-24 MED ORDER — AMOXICILLIN-POT CLAVULANATE 875-125 MG PO TABS
1.0000 | ORAL_TABLET | Freq: Two times a day (BID) | ORAL | 0 refills | Status: AC
Start: 2024-05-24 — End: 2024-06-03

## 2024-05-24 NOTE — Discharge Instructions (Addendum)
The Augmentin twice daily with food for 10 days for treatment of your sinusitis.  Perform sinus irrigation 2-3 times a day with a NeilMed sinus rinse kit and distilled water.  Do not use tap water.  You can use plain over-the-counter Mucinex every 6 hours to break up the stickiness of the mucus so your body can clear it.  Increase your oral fluid intake to thin out your mucus so that is also able for your body to clear more easily.  Take an over-the-counter probiotic, such as Culturelle-align-activia, 1 hour after each dose of antibiotic to prevent diarrhea.  If you develop any new or worsening symptoms return for reevaluation or see your primary care provider.  

## 2024-05-24 NOTE — ED Triage Notes (Signed)
 Pt c/o sinus pressure,facial pain & congestion x5 days. Has tried nasal spray w/o relief.

## 2024-05-24 NOTE — ED Provider Notes (Signed)
 [facility]    CSN: [id number] Arrival date & time: 05/24/24  1108      History   Chief Complaint Chief Complaint  Patient presents with   Sinus Problem    HPI William Hall is a 53 y.o. male.   HPI  53 year old male with past medical history significant for diabetes, GERD, hypertension, sleep apnea, and recurrent sinus infections presents for evaluation of 5 days with a facial pain, sinus pressure, and congestion.  He describes puslike bloody discharge from both naris.  He also had some ear pressure at the outset but that has resolved.  He denies any fever or cough.  He has never had sinus surgery.  He was supposed to be referred by his PCP to an ENT but then had to change physicians and has not seen a specialist.  Past Medical History:  Diagnosis Date   Diabetes mellitus without complication (HCC)    GERD (gastroesophageal reflux disease)     Patient Active Problem List   Diagnosis Date Noted   Hypertension 07/23/2022   Arthralgia of left knee 11/21/2021   Osteoarthritis of left knee 11/21/2021   Gastroesophageal reflux disease without esophagitis 08/07/2020   Sleep apnea 11/04/2017   Prediabetes 02/06/2017   Umbilical hernia 11/24/2013    History reviewed. No pertinent surgical history.     Home Medications    Prior to Admission medications   Medication Sig Start Date End Date Taking? Authorizing Provider  amoxicillin -clavulanate (AUGMENTIN ) 875-125 MG tablet Take 1 tablet by mouth every 12 (twelve) hours for 10 days. 05/24/24 06/03/24 Yes Bernardino Ditch, NP  amLODipine (NORVASC) 5 MG tablet Take 1 tablet by mouth daily. 04/18/23 11/02/24  [provider]  hydrochlorothiazide (HYDRODIURIL) 25 MG tablet Take 25 mg by mouth daily.    [provider]  metFORMIN (GLUCOPHAGE) 500 MG tablet SMARTSIG:1 Tablet(s) By Mouth Every Evening 04/16/22   [provider]  metoprolol  tartrate (LOPRESSOR ) 100 MG tablet Take tablet (100mg ) TWO  hours prior to your cardiac CT scan. 01/16/24   Darliss Rogue, MD  omeprazole (PRILOSEC) 20 MG capsule Take 20 mg by mouth daily.    [provider]  OZEMPIC, 2 MG/DOSE, 8 MG/3ML SOPN Inject 2 mg into the skin once a week. 06/13/22   [provider]  pantoprazole (PROTONIX) 20 MG tablet Take 20 mg by mouth daily.  07/27/19  [provider]    Family History Family History  Problem Relation Age of Onset   Diabetes Mother    Cancer Father     Social History Social History   Tobacco Use   Smoking status: Never   Smokeless tobacco: Never  Vaping Use   Vaping status: Never Used  Substance Use Topics   Alcohol use: No   Drug use: No     Allergies   Metformin, Levonorgestrel-ethinyl estrad, and Topiramate   Review of Systems Review of Systems  Constitutional:  Negative for fever.  HENT:  Positive for congestion, ear pain, rhinorrhea, sinus pressure and sinus pain.   Respiratory:  Negative for cough.      Physical Exam Triage Vital Signs ED Triage Vitals  Encounter Vitals Group     BP      Girls Systolic BP Percentile      Girls Diastolic BP Percentile      Boys Systolic BP Percentile      Boys Diastolic BP Percentile      Pulse      Resp  Temp      Temp src      SpO2      Weight      Height      Head Circumference      Peak Flow      Pain Score      Pain Loc      Pain Education      Exclude from Growth Chart    No data found.  Updated Vital Signs BP (!) 118/93 (BP Location: Left Arm)   Pulse (!) 58   Temp 98.3 F (36.8 C) (Oral)   Resp 16   Ht 6' 1 (1.854 m)   Wt 270 lb 9.6 oz (122.7 kg)   SpO2 97%   BMI 35.70 kg/m   Visual Acuity Right Eye Distance:   Left Eye Distance:   Bilateral Distance:    Right Eye Near:   Left Eye Near:    Bilateral Near:     Physical Exam Vitals and nursing note reviewed.  Constitutional:      Appearance: Normal appearance. He is not ill-appearing.  HENT:     Head:  Normocephalic and atraumatic.     Right Ear: Tympanic membrane, ear canal and external ear normal. There is no impacted cerumen.     Left Ear: Tympanic membrane, ear canal and external ear normal. There is no impacted cerumen.     Nose: Congestion and rhinorrhea present.     Comments: Marked edema and erythema nasal mucosa with bloody discharge in both naris.  Tenderness to compression of bilateral maxillary sinuses.  Frontal sinuses are benign.    Mouth/Throat:     Mouth: Mucous membranes are moist.     Pharynx: Oropharynx is clear. No oropharyngeal exudate or posterior oropharyngeal erythema.  Skin:    General: Skin is warm and dry.     Capillary Refill: Capillary refill takes less than 2 seconds.     Findings: No rash.  Neurological:     General: No focal deficit present.     Mental Status: He is alert and oriented to person, place, and time.      UC Treatments / Results  Labs (all labs ordered are listed, but only abnormal results are displayed) Labs Reviewed - No data to display  EKG   Radiology No results found.  Procedures Procedures (including critical care time)  Medications Ordered in UC Medications - No data to display  Initial Impression / Assessment and Plan / UC Course  I have reviewed the triage vital signs and the nursing notes.  Pertinent labs & imaging results that were available during my care of the patient were reviewed by me and considered in my medical decision making (see chart for details).   Patient is a nontoxic-appearing 53 year old male presenting for evaluation of maxillary sinus pain and pressure as outlined in HPI above.  The patient reports that he typically has 2-3 sinus infections a year.  His old PCP had referred him to an ENT but then he had to change doctors so he has not seen ear nose and throat regarding his recurrent sinus infections.  He has been using nasal spray to help with his congestion with mild relief of symptoms.  He has  purulent discharge mixed with blood in both naris as well as tenderness to compression of bilateral maxillary sinuses.  I will treat him for maxillary sinusitis with a 10-day course of Augmentin .  We have discussed using sinus irrigation as well as Mucinex to help break  up the thickness of his mucus.  Return precautions reviewed.  He denies need for work note.   Final Clinical Impressions(s) / UC Diagnoses   Final diagnoses:  Acute maxillary sinusitis, recurrence not specified     Discharge Instructions      The Augmentin  twice daily with food for 10 days for treatment of your sinusitis.  Perform sinus irrigation 2-3 times a day with a NeilMed sinus rinse kit and distilled water.  Do not use tap water.  You can use plain over-the-counter Mucinex every 6 hours to break up the stickiness of the mucus so your body can clear it.  Increase your oral fluid intake to thin out your mucus so that is also able for your body to clear more easily.  Take an over-the-counter probiotic, such as Culturelle-align-activia, 1 hour after each dose of antibiotic to prevent diarrhea.  If you develop any new or worsening symptoms return for reevaluation or see your primary care provider.      ED Prescriptions     Medication Sig Dispense Auth. Provider   amoxicillin -clavulanate (AUGMENTIN ) 875-125 MG tablet Take 1 tablet by mouth every 12 (twelve) hours for 10 days. 20 tablet Bernardino Ditch, NP      PDMP not reviewed this encounter.   Bernardino Ditch, NP 05/24/24 [phone]
